# Patient Record
Sex: Male | Born: 1970 | Race: Black or African American | Hispanic: No | Marital: Single | Smoking: Never smoker
Health system: Southern US, Community
[De-identification: ages and names within clinical notes are randomized; demographics above are authoritative.]

## PROBLEM LIST (undated history)

## (undated) DIAGNOSIS — Z1211 Encounter for screening for malignant neoplasm of colon: Secondary | ICD-10-CM

## (undated) DIAGNOSIS — K921 Melena: Secondary | ICD-10-CM

---

## 1998-05-28 ENCOUNTER — Emergency Department (HOSPITAL_COMMUNITY): Admission: EM | Admit: 1998-05-28 | Discharge: 1998-05-28 | Payer: Self-pay | Admitting: Emergency Medicine

## 1999-07-28 ENCOUNTER — Emergency Department (HOSPITAL_COMMUNITY): Admission: EM | Admit: 1999-07-28 | Discharge: 1999-07-28 | Payer: Self-pay | Admitting: Emergency Medicine

## 2000-02-28 ENCOUNTER — Emergency Department (HOSPITAL_COMMUNITY): Admission: EM | Admit: 2000-02-28 | Discharge: 2000-02-28 | Payer: Self-pay | Admitting: Emergency Medicine

## 2000-09-12 ENCOUNTER — Emergency Department (HOSPITAL_COMMUNITY): Admission: EM | Admit: 2000-09-12 | Discharge: 2000-09-12 | Payer: Self-pay | Admitting: *Deleted

## 2001-03-14 ENCOUNTER — Emergency Department (HOSPITAL_COMMUNITY): Admission: EM | Admit: 2001-03-14 | Discharge: 2001-03-14 | Payer: Self-pay | Admitting: Emergency Medicine

## 2002-08-13 ENCOUNTER — Emergency Department (HOSPITAL_COMMUNITY): Admission: EM | Admit: 2002-08-13 | Discharge: 2002-08-13 | Payer: Self-pay | Admitting: Emergency Medicine

## 2003-02-02 ENCOUNTER — Encounter (INDEPENDENT_AMBULATORY_CARE_PROVIDER_SITE_OTHER): Payer: Self-pay | Admitting: *Deleted

## 2003-02-02 LAB — CONVERTED CEMR LAB
CD4 Count: 286 microliters
CD4 T Cell Abs: 286

## 2003-02-20 DIAGNOSIS — B20 Human immunodeficiency virus [HIV] disease: Secondary | ICD-10-CM

## 2003-03-03 ENCOUNTER — Encounter: Payer: Self-pay | Admitting: Infectious Diseases

## 2003-03-03 ENCOUNTER — Encounter: Admission: RE | Admit: 2003-03-03 | Discharge: 2003-03-03 | Payer: Self-pay | Admitting: Infectious Diseases

## 2003-03-03 ENCOUNTER — Ambulatory Visit (HOSPITAL_COMMUNITY): Admission: RE | Admit: 2003-03-03 | Discharge: 2003-03-03 | Payer: Self-pay | Admitting: Infectious Diseases

## 2003-06-03 ENCOUNTER — Encounter: Payer: Self-pay | Admitting: Infectious Diseases

## 2003-06-03 ENCOUNTER — Encounter: Admission: RE | Admit: 2003-06-03 | Discharge: 2003-06-03 | Payer: Self-pay | Admitting: Infectious Diseases

## 2003-06-03 ENCOUNTER — Ambulatory Visit (HOSPITAL_COMMUNITY): Admission: RE | Admit: 2003-06-03 | Discharge: 2003-06-03 | Payer: Self-pay | Admitting: Infectious Diseases

## 2005-01-10 ENCOUNTER — Ambulatory Visit: Payer: Self-pay | Admitting: Infectious Diseases

## 2005-01-10 ENCOUNTER — Ambulatory Visit (HOSPITAL_COMMUNITY): Admission: RE | Admit: 2005-01-10 | Discharge: 2005-01-10 | Payer: Self-pay | Admitting: Infectious Diseases

## 2005-01-25 ENCOUNTER — Ambulatory Visit: Payer: Self-pay | Admitting: Infectious Diseases

## 2005-02-08 ENCOUNTER — Emergency Department (HOSPITAL_COMMUNITY): Admission: EM | Admit: 2005-02-08 | Discharge: 2005-02-08 | Payer: Self-pay | Admitting: Emergency Medicine

## 2005-05-15 ENCOUNTER — Ambulatory Visit (HOSPITAL_COMMUNITY): Admission: RE | Admit: 2005-05-15 | Discharge: 2005-05-15 | Payer: Self-pay | Admitting: Infectious Diseases

## 2005-05-15 ENCOUNTER — Ambulatory Visit: Payer: Self-pay | Admitting: Infectious Diseases

## 2005-05-31 ENCOUNTER — Ambulatory Visit: Payer: Self-pay | Admitting: Infectious Diseases

## 2005-05-31 ENCOUNTER — Ambulatory Visit (HOSPITAL_COMMUNITY): Admission: RE | Admit: 2005-05-31 | Discharge: 2005-05-31 | Payer: Self-pay | Admitting: Infectious Diseases

## 2005-07-10 ENCOUNTER — Ambulatory Visit: Payer: Self-pay | Admitting: Infectious Diseases

## 2005-07-17 ENCOUNTER — Ambulatory Visit: Payer: Self-pay | Admitting: Infectious Diseases

## 2005-07-31 ENCOUNTER — Ambulatory Visit: Payer: Self-pay | Admitting: Infectious Diseases

## 2005-08-09 ENCOUNTER — Ambulatory Visit: Payer: Self-pay | Admitting: Infectious Diseases

## 2005-08-28 ENCOUNTER — Ambulatory Visit: Payer: Self-pay | Admitting: Infectious Diseases

## 2005-09-25 ENCOUNTER — Ambulatory Visit: Payer: Self-pay | Admitting: Infectious Diseases

## 2005-11-08 ENCOUNTER — Ambulatory Visit: Payer: Self-pay | Admitting: Infectious Diseases

## 2006-01-10 ENCOUNTER — Encounter (INDEPENDENT_AMBULATORY_CARE_PROVIDER_SITE_OTHER): Payer: Self-pay | Admitting: *Deleted

## 2006-01-10 ENCOUNTER — Ambulatory Visit: Payer: Self-pay | Admitting: Infectious Diseases

## 2006-01-10 LAB — CONVERTED CEMR LAB
CD4 Count: 506 microliters
HIV 1 RNA Quant: 49 copies/mL

## 2006-04-06 ENCOUNTER — Ambulatory Visit: Payer: Self-pay | Admitting: Infectious Diseases

## 2006-04-06 ENCOUNTER — Encounter (INDEPENDENT_AMBULATORY_CARE_PROVIDER_SITE_OTHER): Payer: Self-pay | Admitting: *Deleted

## 2006-04-12 DIAGNOSIS — J45909 Unspecified asthma, uncomplicated: Secondary | ICD-10-CM | POA: Insufficient documentation

## 2006-07-05 ENCOUNTER — Ambulatory Visit: Payer: Self-pay | Admitting: Infectious Diseases

## 2006-07-05 ENCOUNTER — Encounter (INDEPENDENT_AMBULATORY_CARE_PROVIDER_SITE_OTHER): Payer: Self-pay | Admitting: *Deleted

## 2006-07-05 LAB — CONVERTED CEMR LAB
ALT: 119 units/L — ABNORMAL HIGH (ref 0–53)
AST: 40 units/L — ABNORMAL HIGH (ref 0–37)
Alkaline Phosphatase: 109 units/L (ref 39–117)
BUN: 13 mg/dL (ref 6–23)
CD4 Count: 439 microliters
Calcium: 9.1 mg/dL (ref 8.4–10.5)
Cholesterol: 166 mg/dL (ref 0–200)
Creatinine, Ser: 0.91 mg/dL (ref 0.40–1.50)
Creatinine, Urine: 417.6 mg/dL
HIV 1 RNA Quant: 49 copies/mL
Hemoglobin, Urine: NEGATIVE
Indirect Bilirubin: 0.2 mg/dL (ref 0.0–0.9)
LDL Cholesterol: 89 mg/dL (ref 0–99)
Leukocytes, UA: NEGATIVE
Phosphorus: 4.3 mg/dL (ref 2.3–4.6)
Protein, ur: NEGATIVE mg/dL
Total CHOL/HDL Ratio: 4.7
Total Protein: 8.9 g/dL — ABNORMAL HIGH (ref 6.0–8.3)
VLDL: 42 mg/dL — ABNORMAL HIGH (ref 0–40)
pH: 5.5 (ref 5.0–8.0)

## 2006-08-27 ENCOUNTER — Encounter (INDEPENDENT_AMBULATORY_CARE_PROVIDER_SITE_OTHER): Payer: Self-pay | Admitting: *Deleted

## 2006-08-27 LAB — CONVERTED CEMR LAB

## 2006-09-09 ENCOUNTER — Encounter (INDEPENDENT_AMBULATORY_CARE_PROVIDER_SITE_OTHER): Payer: Self-pay | Admitting: *Deleted

## 2006-09-20 ENCOUNTER — Ambulatory Visit: Payer: Self-pay | Admitting: Infectious Diseases

## 2006-09-20 ENCOUNTER — Encounter: Payer: Self-pay | Admitting: Infectious Diseases

## 2006-09-20 LAB — CONVERTED CEMR LAB
ALT: 57 units/L — ABNORMAL HIGH (ref 0–53)
Alkaline Phosphatase: 133 units/L — ABNORMAL HIGH (ref 39–117)
CD4 Count: 310 microliters
Calcium: 9.5 mg/dL (ref 8.4–10.5)
Cholesterol: 146 mg/dL (ref 0–200)
Creatinine, Ser: 0.71 mg/dL (ref 0.40–1.50)
HDL: 36 mg/dL — ABNORMAL LOW (ref >39)
HIV 1 RNA Quant: 49 copies/mL
LDL Cholesterol: 75 mg/dL (ref 0–99)
Total Bilirubin: 0.3 mg/dL (ref 0.3–1.2)
Total CHOL/HDL Ratio: 4.1

## 2006-10-01 ENCOUNTER — Encounter: Payer: Self-pay | Admitting: Infectious Diseases

## 2006-12-19 ENCOUNTER — Ambulatory Visit: Payer: Self-pay | Admitting: Infectious Diseases

## 2007-02-01 ENCOUNTER — Emergency Department (HOSPITAL_COMMUNITY): Admission: EM | Admit: 2007-02-01 | Discharge: 2007-02-01 | Payer: Self-pay | Admitting: Emergency Medicine

## 2007-03-13 ENCOUNTER — Ambulatory Visit: Payer: Self-pay | Admitting: Infectious Diseases

## 2007-05-23 ENCOUNTER — Encounter (INDEPENDENT_AMBULATORY_CARE_PROVIDER_SITE_OTHER): Payer: Self-pay | Admitting: *Deleted

## 2007-05-27 ENCOUNTER — Ambulatory Visit: Payer: Self-pay | Admitting: Infectious Diseases

## 2007-05-27 ENCOUNTER — Encounter: Payer: Self-pay | Admitting: Infectious Diseases

## 2007-05-27 DIAGNOSIS — R21 Rash and other nonspecific skin eruption: Secondary | ICD-10-CM

## 2007-05-27 LAB — CONVERTED CEMR LAB
ALT: 73 units/L — ABNORMAL HIGH (ref 0–53)
Albumin: 4.2 g/dL (ref 3.5–5.2)
Bilirubin Urine: NEGATIVE
CD4 Count: 441 microliters
CO2: 26 meq/L (ref 19–32)
Calcium: 9.3 mg/dL (ref 8.4–10.5)
Chloride: 101 meq/L (ref 96–112)
Cholesterol: 150 mg/dL (ref 0–200)
Creatinine, Ser: 0.79 mg/dL (ref 0.40–1.50)
Glucose, Bld: 104 mg/dL — ABNORMAL HIGH (ref 70–99)
Hemoglobin, Urine: NEGATIVE
Hep A Total Ab: NEGATIVE
Hep B Core Total Ab: POSITIVE — AB
Hepatitis B Surface Ag: NEGATIVE
Ketones, ur: NEGATIVE mg/dL
LDL Cholesterol: 83 mg/dL (ref 0–99)
Leukocytes, UA: NEGATIVE
Nitrite: NEGATIVE
Phosphorus: 3.9 mg/dL (ref 2.3–4.6)
Potassium: 4.2 meq/L (ref 3.5–5.3)
Protein, ur: NEGATIVE mg/dL
Triglycerides: 160 mg/dL — ABNORMAL HIGH (ref <150)
Urine Glucose: NEGATIVE mg/dL
Urobilinogen, UA: 0.2 (ref 0.0–1.0)
VLDL: 32 mg/dL (ref 0–40)

## 2007-07-01 ENCOUNTER — Encounter (INDEPENDENT_AMBULATORY_CARE_PROVIDER_SITE_OTHER): Payer: Self-pay | Admitting: *Deleted

## 2007-07-02 ENCOUNTER — Encounter (INDEPENDENT_AMBULATORY_CARE_PROVIDER_SITE_OTHER): Payer: Self-pay | Admitting: *Deleted

## 2007-07-02 ENCOUNTER — Encounter: Payer: Self-pay | Admitting: Infectious Diseases

## 2007-08-22 ENCOUNTER — Ambulatory Visit: Payer: Self-pay | Admitting: Infectious Diseases

## 2007-10-07 ENCOUNTER — Emergency Department (HOSPITAL_COMMUNITY): Admission: EM | Admit: 2007-10-07 | Discharge: 2007-10-07 | Payer: Self-pay | Admitting: Family Medicine

## 2007-10-28 ENCOUNTER — Ambulatory Visit: Payer: Self-pay | Admitting: Infectious Diseases

## 2007-10-28 DIAGNOSIS — K625 Hemorrhage of anus and rectum: Secondary | ICD-10-CM

## 2007-11-18 ENCOUNTER — Ambulatory Visit: Payer: Self-pay | Admitting: Infectious Diseases

## 2007-12-04 ENCOUNTER — Encounter: Payer: Self-pay | Admitting: Infectious Diseases

## 2007-12-12 ENCOUNTER — Telehealth: Payer: Self-pay | Admitting: Infectious Diseases

## 2008-01-13 ENCOUNTER — Encounter: Payer: Self-pay | Admitting: Infectious Diseases

## 2008-02-11 ENCOUNTER — Emergency Department (HOSPITAL_COMMUNITY): Admission: EM | Admit: 2008-02-11 | Discharge: 2008-02-11 | Payer: Self-pay | Admitting: Emergency Medicine

## 2008-02-19 ENCOUNTER — Ambulatory Visit: Payer: Self-pay | Admitting: Infectious Diseases

## 2008-03-02 ENCOUNTER — Telehealth (INDEPENDENT_AMBULATORY_CARE_PROVIDER_SITE_OTHER): Payer: Self-pay | Admitting: *Deleted

## 2008-03-11 ENCOUNTER — Ambulatory Visit: Payer: Self-pay | Admitting: Infectious Diseases

## 2008-05-11 ENCOUNTER — Ambulatory Visit: Payer: Self-pay | Admitting: Infectious Diseases

## 2008-06-29 ENCOUNTER — Ambulatory Visit: Payer: Self-pay | Admitting: Infectious Diseases

## 2008-08-11 ENCOUNTER — Ambulatory Visit: Payer: Self-pay | Admitting: Infectious Diseases

## 2008-08-11 ENCOUNTER — Telehealth: Payer: Self-pay

## 2008-08-11 DIAGNOSIS — K029 Dental caries, unspecified: Secondary | ICD-10-CM | POA: Insufficient documentation

## 2008-08-11 DIAGNOSIS — L851 Acquired keratosis [keratoderma] palmaris et plantaris: Secondary | ICD-10-CM

## 2008-08-14 ENCOUNTER — Telehealth (INDEPENDENT_AMBULATORY_CARE_PROVIDER_SITE_OTHER): Payer: Self-pay | Admitting: *Deleted

## 2008-08-19 ENCOUNTER — Encounter: Payer: Self-pay | Admitting: Infectious Diseases

## 2008-11-20 ENCOUNTER — Ambulatory Visit: Payer: Self-pay | Admitting: Infectious Diseases

## 2008-11-20 LAB — CONVERTED CEMR LAB
Albumin: 4.2 g/dL (ref 3.5–5.2)
BUN: 13 mg/dL (ref 6–23)
Basophils Relative: 1 % (ref 0–1)
CO2: 24 meq/L (ref 19–32)
Chloride: 103 meq/L (ref 96–112)
Eosinophils Relative: 4 % (ref 0–5)
GFR calc non Af Amer: >60 mL/min (ref >60)
LDL Cholesterol: 103 mg/dL — ABNORMAL HIGH (ref 0–99)
Lymphocytes Relative: 32 % (ref 12–46)
MCHC: 34.1 g/dL (ref 30.0–36.0)
Monocytes Relative: 12 % (ref 3–12)
Neutro Abs: 2.6 10*3/uL (ref 1.7–7.7)
Neutrophils Relative %: 51 % (ref 43–77)
Platelets: 289 10*3/uL (ref 150–400)
RDW: 14.5 % (ref 11.5–15.5)
Total Protein: 7.6 g/dL (ref 6.0–8.3)
VLDL: 25 mg/dL (ref 0–40)

## 2009-01-28 ENCOUNTER — Telehealth (INDEPENDENT_AMBULATORY_CARE_PROVIDER_SITE_OTHER): Payer: Self-pay | Admitting: *Deleted

## 2009-02-12 ENCOUNTER — Ambulatory Visit: Payer: Self-pay | Admitting: Infectious Diseases

## 2009-04-29 ENCOUNTER — Encounter: Payer: Self-pay | Admitting: Infectious Disease

## 2009-05-05 ENCOUNTER — Encounter: Payer: Self-pay | Admitting: Infectious Diseases

## 2009-05-06 ENCOUNTER — Ambulatory Visit: Payer: Self-pay | Admitting: Infectious Diseases

## 2009-06-23 ENCOUNTER — Ambulatory Visit: Payer: Self-pay | Admitting: Infectious Diseases

## 2009-06-23 LAB — CONVERTED CEMR LAB
CD4 Count: 442 microliters
Calcium: 9.4 mg/dL (ref 8.4–10.5)
Creatinine, Ser: 0.79 mg/dL (ref 0.40–1.50)
Glucose, Bld: 118 mg/dL — ABNORMAL HIGH (ref 70–99)
HDL: 37 mg/dL — ABNORMAL LOW (ref >39)
HIV 1 RNA Quant: 39 copies/mL
Potassium: 4.2 meq/L (ref 3.5–5.3)
Sodium: 140 meq/L (ref 135–145)
Total CHOL/HDL Ratio: 4.6
VLDL: 25 mg/dL (ref 0–40)

## 2009-10-06 ENCOUNTER — Encounter: Payer: Self-pay | Admitting: Infectious Diseases

## 2009-10-21 ENCOUNTER — Ambulatory Visit: Payer: Self-pay | Admitting: Infectious Diseases

## 2009-10-21 LAB — CONVERTED CEMR LAB
Basophils Absolute: 0 10*3/uL (ref 0.0–0.1)
Basophils Relative: 1 % (ref 0–1)
CO2: 23 meq/L (ref 19–32)
Chloride: 104 meq/L (ref 96–112)
Cholesterol: 170 mg/dL (ref 0–200)
Creatinine, Ser: 0.85 mg/dL (ref 0.40–1.50)
Eosinophils Absolute: 0.2 10*3/uL (ref 0.0–0.7)
HCT: 43.3 % (ref 39.0–52.0)
HDL: 36 mg/dL — ABNORMAL LOW (ref >39)
Hemoglobin: 13.8 g/dL (ref 13.0–17.0)
Lymphs Abs: 1.3 10*3/uL (ref 0.7–4.0)
MCV: 82.5 fL (ref 78.0–100.0)
Neutro Abs: 2.5 10*3/uL (ref 1.7–7.7)
Neutrophils Relative %: 56 % (ref 43–77)
Sodium: 138 meq/L (ref 135–145)
Total Protein: 7.2 g/dL (ref 6.0–8.3)
WBC: 4.5 10*3/uL (ref 4.0–10.5)

## 2009-10-27 ENCOUNTER — Encounter: Payer: Self-pay | Admitting: Infectious Diseases

## 2009-11-10 ENCOUNTER — Encounter: Payer: Self-pay | Admitting: Infectious Diseases

## 2010-01-25 ENCOUNTER — Ambulatory Visit: Payer: Self-pay | Admitting: Infectious Diseases

## 2010-01-25 LAB — CONVERTED CEMR LAB
AST: 22 units/L (ref 0–37)
Albumin: 4.4 g/dL (ref 3.5–5.2)
Alkaline Phosphatase: 103 units/L (ref 39–117)
CO2: 25 meq/L (ref 19–32)
Cholesterol: 188 mg/dL (ref 0–200)
Creatinine, Ser: 0.83 mg/dL (ref 0.40–1.50)
Total Bilirubin: 0.4 mg/dL (ref 0.3–1.2)
Total CHOL/HDL Ratio: 5.2
Total Protein: 7.5 g/dL (ref 6.0–8.3)
Triglycerides: 170 mg/dL — ABNORMAL HIGH (ref <150)
VLDL: 34 mg/dL (ref 0–40)

## 2010-04-11 ENCOUNTER — Ambulatory Visit: Payer: Self-pay | Admitting: Internal Medicine

## 2010-04-11 DIAGNOSIS — J209 Acute bronchitis, unspecified: Secondary | ICD-10-CM | POA: Insufficient documentation

## 2010-05-20 ENCOUNTER — Ambulatory Visit: Payer: Self-pay | Admitting: Infectious Diseases

## 2010-06-02 ENCOUNTER — Ambulatory Visit: Payer: Self-pay | Admitting: Infectious Diseases

## 2010-06-02 ENCOUNTER — Encounter (INDEPENDENT_AMBULATORY_CARE_PROVIDER_SITE_OTHER): Payer: Self-pay | Admitting: *Deleted

## 2010-06-02 ENCOUNTER — Ambulatory Visit: Payer: Self-pay | Admitting: Internal Medicine

## 2010-06-02 DIAGNOSIS — R609 Edema, unspecified: Secondary | ICD-10-CM | POA: Insufficient documentation

## 2010-06-02 LAB — CONVERTED CEMR LAB
ALT: 66 units/L — ABNORMAL HIGH (ref 0–53)
AST: 24 units/L (ref 0–37)
BUN: 14 mg/dL (ref 6–23)
CD4 Count: 486 microliters
Chloride: 106 meq/L (ref 96–112)
Creatinine, Ser: 0.82 mg/dL (ref 0.40–1.50)
HDL: 37 mg/dL — ABNORMAL LOW (ref >39)
HIV 1 RNA Quant: 39 copies/mL
Sodium: 141 meq/L (ref 135–145)
Total Bilirubin: 0.3 mg/dL (ref 0.3–1.2)
Total Protein: 7.4 g/dL (ref 6.0–8.3)
Triglycerides: 147 mg/dL (ref <150)

## 2010-06-27 ENCOUNTER — Emergency Department (HOSPITAL_COMMUNITY)
Admission: EM | Admit: 2010-06-27 | Discharge: 2010-06-27 | Payer: Self-pay | Source: Home / Self Care | Admitting: Family Medicine

## 2010-07-04 ENCOUNTER — Emergency Department (HOSPITAL_COMMUNITY)
Admission: EM | Admit: 2010-07-04 | Discharge: 2010-07-04 | Payer: Self-pay | Source: Home / Self Care | Admitting: Family Medicine

## 2010-07-31 LAB — CONVERTED CEMR LAB
ALT: 56 units/L — ABNORMAL HIGH (ref 0–53)
ALT: 57 units/L — ABNORMAL HIGH (ref 0–53)
ALT: 74 units/L — ABNORMAL HIGH (ref 0–53)
AST: 29 units/L (ref 0–37)
Albumin: 4 g/dL (ref 3.5–5.2)
Albumin: 4.2 g/dL (ref 3.5–5.2)
Albumin: 4.6 g/dL (ref 3.5–5.2)
Alkaline Phosphatase: 103 units/L (ref 39–117)
Alkaline Phosphatase: 109 units/L (ref 39–117)
Alkaline Phosphatase: 113 units/L (ref 39–117)
Alkaline Phosphatase: 118 units/L — ABNORMAL HIGH (ref 39–117)
BUN: 10 mg/dL (ref 6–23)
BUN: 11 mg/dL (ref 6–23)
BUN: 12 mg/dL (ref 6–23)
BUN: 12 mg/dL (ref 6–23)
BUN: 13 mg/dL (ref 6–23)
BUN: 15 mg/dL (ref 6–23)
BUN: 9 mg/dL (ref 6–23)
Bilirubin Urine: NEGATIVE
Bilirubin, Direct: 0.1 mg/dL (ref 0.0–0.3)
Bilirubin, Direct: 0.1 mg/dL (ref 0.0–0.3)
Bilirubin, Direct: 0.1 mg/dL (ref 0.0–0.3)
Bilirubin, Direct: 0.1 mg/dL (ref 0.0–0.3)
Bilirubin, Direct: <0.1 mg/dL (ref 0.0–0.3)
CD4 Count: 319 microliters
CD4 Count: 372 microliters
CD4 Count: 383 microliters
CD4 Count: 413 microliters
CD4 Count: 688 microliters
CO2: 25 meq/L (ref 19–32)
CO2: 25 meq/L (ref 19–32)
CO2: 25 meq/L (ref 19–32)
CO2: 27 meq/L (ref 19–32)
CO2: 27 meq/L (ref 19–32)
CO2: 27 meq/L (ref 19–32)
CO2: 27 meq/L (ref 19–32)
Calcium: 8.7 mg/dL (ref 8.4–10.5)
Calcium: 8.9 mg/dL (ref 8.4–10.5)
Chloride: 102 meq/L (ref 96–112)
Chloride: 104 meq/L (ref 96–112)
Chloride: 104 meq/L (ref 96–112)
Chloride: 106 meq/L (ref 96–112)
Creatinine, Ser: 0.81 mg/dL (ref 0.40–1.50)
Creatinine, Ser: 0.82 mg/dL (ref 0.40–1.50)
Creatinine, Ser: 0.86 mg/dL (ref 0.40–1.50)
Creatinine, Ser: 0.88 mg/dL (ref 0.40–1.50)
Creatinine, Ser: 0.96 mg/dL (ref 0.40–1.50)
Creatinine, Urine: 228.1 mg/dL
Glucose, Bld: 110 mg/dL — ABNORMAL HIGH (ref 70–99)
Glucose, Bld: 112 mg/dL — ABNORMAL HIGH (ref 70–99)
Glucose, Bld: 93 mg/dL (ref 70–99)
HDL: 30 mg/dL — ABNORMAL LOW (ref >39)
HDL: 33 mg/dL — ABNORMAL LOW (ref >39)
HDL: 33 mg/dL — ABNORMAL LOW (ref >39)
HDL: 35 mg/dL — ABNORMAL LOW (ref >39)
HDL: 42 mg/dL (ref >39)
HIV 1 RNA Quant: 39 copies/mL
HIV 1 RNA Quant: 39 copies/mL
HIV 1 RNA Quant: 49 copies/mL
Hemoglobin, Urine: NEGATIVE
Hemoglobin, Urine: NEGATIVE
Hepatitis B Surface Ag: NEGATIVE
Indirect Bilirubin: 0.2 mg/dL (ref 0.0–0.9)
Ketones, ur: NEGATIVE mg/dL
Ketones, ur: NEGATIVE mg/dL
LDL Cholesterol: 136 mg/dL — ABNORMAL HIGH (ref 0–99)
LDL Cholesterol: 67 mg/dL (ref 0–99)
LDL Cholesterol: 86 mg/dL (ref 0–99)
Nitrite: NEGATIVE
Phosphorus: 4.1 mg/dL (ref 2.3–4.6)
Phosphorus: 5 mg/dL — ABNORMAL HIGH (ref 2.3–4.6)
Potassium: 4.3 meq/L (ref 3.5–5.3)
Protein, ur: NEGATIVE mg/dL
Sodium: 140 meq/L (ref 135–145)
Sodium: 141 meq/L (ref 135–145)
Sodium: 141 meq/L (ref 135–145)
Specific Gravity, Urine: 1.03 (ref 1.005–1.03)
Specific Gravity, Urine: 1.031 — ABNORMAL HIGH (ref 1.005–1.03)
Specific Gravity, Urine: 1.035 — ABNORMAL HIGH (ref 1.005–1.03)
Total Bilirubin: 0.3 mg/dL (ref 0.3–1.2)
Total Bilirubin: 0.3 mg/dL (ref 0.3–1.2)
Total Bilirubin: 0.5 mg/dL (ref 0.3–1.2)
Total CHOL/HDL Ratio: 4
Total CHOL/HDL Ratio: 4.2
Total CHOL/HDL Ratio: 4.8
Total CHOL/HDL Ratio: 5.6
Total CHOL/HDL Ratio: 5.7
Total Protein: 7.7 g/dL (ref 6.0–8.3)
Total Protein: 7.7 g/dL (ref 6.0–8.3)
Total Protein: 8 g/dL (ref 6.0–8.3)
Total Protein: 8.1 g/dL (ref 6.0–8.3)
Total Protein: 8.9 g/dL — ABNORMAL HIGH (ref 6.0–8.3)
Triglycerides: 124 mg/dL (ref <150)
Triglycerides: 149 mg/dL (ref <150)
Triglycerides: 201 mg/dL — ABNORMAL HIGH (ref <150)
Triglycerides: 70 mg/dL (ref <150)
Triglycerides: 97 mg/dL (ref <150)
Urine Glucose: NEGATIVE mg/dL
Urine Glucose: NEGATIVE mg/dL
Urine Glucose: NEGATIVE mg/dL
Urobilinogen, UA: 1 (ref 0.0–1.0)
VLDL: 14 mg/dL (ref 0–40)
VLDL: 25 mg/dL (ref 0–40)
VLDL: 40 mg/dL (ref 0–40)
pH: 5.5 (ref 5.0–8.0)
pH: 6 (ref 5.0–8.0)

## 2010-08-02 NOTE — Miscellaneous (Signed)
Summary: Appointment No Show  Appointment status changed to no show by [employer] on 10/06/2009 4:36 PM.  No Show Comments ---------------- 5 MONTH F/U/CFB  Appointment Information ----------------------- Appt Type:  ID OFFICE VISIT      Date:  Wednesday, October 06, 2009      Time:  10:00 AM for 15 min   Urgency:  Routine   Made By:  Hoy Finlay Scheduler  To Visit:  [id number]    Reason:  5 MONTH F/U/CFB  Appt Comments ------------- -- 10/06/09 16:36: ([facility]) NO SHOW -- 5 MONTH F/U/CFB -- 09/24/09 10:01: ([facility]) BOOKED -- Routine ID OFFICE VISIT at 10/06/2009 10:00 AM for 15 min 5 MONTH F/U/CFB

## 2010-08-02 NOTE — Miscellaneous (Signed)
Summary: HIV-1 RNA, CD4 (RESEARCH)  Clinical Lists Changes  Observations: Added new observation of CD4 COUNT: 442 microliters (06/23/2009 15:50) Added new observation of HIV1RNA QA: 39 copies/mL (06/23/2009 15:50)

## 2010-08-02 NOTE — Assessment & Plan Note (Signed)
Summary: F/U OV/VS   CC:  f/u and wants cheaper asthma medicine.  History of Present Illness: patient is here for follow-up.  He complains of some swelling in his feet.  No shortness of breath or chest pain.  No PND.  He would also like to keep her albuterol inhaler because he's having problems affording his current one.   Updated Prior Medication List: SUSTIVA 600 MG TABS (EFAVIRENZ) Take 1 tablet by mouth at bedtime EPZICOM 600-300 MG TABS (ABACAVIR SULFATE-LAMIVUDINE) Take 1 tablet by mouth once a day FLOVENT DISKUS 100 MCG/BLIST AEPB (FLUTICASONE PROPIONATE (INHAL)) one inhalation once daily SINGULAIR 10 MG TABS (MONTELUKAST SODIUM) Take 1 tablet by mouth once a day ALBUTEROL SULFATE 0.083 % NEB SOL (ALBUTEROL SULFATE) every 4 hours as needed for wheezing. LASIX 40 MG TABS (FUROSEMIDE) Take 1 tablet by mouth once a day VENTOLIN HFA 108 (90 BASE) MCG/ACT AERS (ALBUTEROL SULFATE) one puff every 6hours prn  Current Allergies (reviewed today): No known allergies  Past History:  Past Medical History: Last updated: 05/06/2009 HIV disease Asthma- no home O2, never intubated.   Review of Systems  The patient denies anorexia, fever, weight loss, chest pain, and dyspnea on exertion.    Vital Signs:  Patient profile:   40 year old male Height:      67 inches (170.18 cm) Weight:      245 pounds (111.36 kg) BMI:     38.51 Temp:     98.2 degrees F (36.78 degrees C) oral Pulse rate:   89 / minute BP sitting:   137 / 79  (left arm)  Vitals Entered By: Starleen Arms CMA (June 02, 2010 3:02 PM) CC: f/u, wants cheaper asthma medicine  Does patient need assistance? Functional Status Self care Ambulation Normal   Physical Exam  General:  alert, well-developed, well-nourished, and well-hydrated.   Head:  normocephalic and atraumatic.   Mouth:  pharynx pink and moist.  no thrush  Lungs:  normal breath sounds and no wheezes.     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 F/u with Dr. Ninetta Lights. Orders: Est. Patient Level IV (82956)  Diagnostics Reviewed:  CD4: 500 (01/25/2010)   WBC: 4.5 (10/21/2009)   Hgb: 13.8 (10/21/2009)   HCT: 43.3 (10/21/2009)   Platelets: 314 (10/21/2009) HIV-1 RNA: 39 (01/25/2010)   HBSAg: NEG (02/12/2009)  Problem # 2:  ASTHMA (ICD-493.90) will switch to ventolin which according tothe computer list is the cheapest. The following medications were removed from the medication list:    Proair Hfa 108 (90 Base) Mcg/act Aers (Albuterol sulfate) .Marland Kitchen... 1-2 puffs q4 hours as needed shortness of breath His updated medication list for this problem includes:    Flovent Diskus 100 Mcg/blist Aepb (Fluticasone propionate (inhal)) ..... One inhalation once daily    Singulair 10 Mg Tabs (Montelukast sodium) .Marland Kitchen... Take 1 tablet by mouth once a day    Albuterol Sulfate 0.083 % Neb Sol (Albuterol sulfate) ..... Every 4 hours as needed for wheezing.    Ventolin Hfa 108 (90 Base) Mcg/act Aers (Albuterol sulfate) ..... One puff every 6hours prn  Problem # 3:  PERIPHERAL EDEMA (ICD-782.3) shor course of lasix His updated medication list for this problem includes:    Lasix 40 Mg Tabs (Furosemide) .Marland Kitchen... Take 1 tablet by mouth once a day  Medications Added to Medication List This Visit: 1)  Lasix 40 Mg Tabs (Furosemide) .... Take 1 tablet by  mouth once a day 2)  Ventolin Hfa 108 (90 Base) Mcg/act Aers (Albuterol sulfate) .... One puff every 6hours prn  Patient Instructions: 1)  follow-up per study Prescriptions: LASIX 40 MG TABS (FUROSEMIDE) Take 1 tablet by mouth once a day  #7 x 0   Entered and Authorized by:   Yisroel Ramming MD   Signed by:   Yisroel Ramming MD on 06/02/2010   Method used:   Print then Give to Patient   RxID:   [id number] VENTOLIN HFA 108 (90 BASE) MCG/ACT AERS (ALBUTEROL SULFATE) one puff every 6hours prn  #1 x 5   Entered and Authorized by:   Yisroel Ramming MD   Signed by:   Yisroel Ramming MD on 06/02/2010   Method used:   Print then Give to Patient   RxID:   [id number] EPZICOM 600-300 MG TABS (ABACAVIR SULFATE-LAMIVUDINE) Take 1 tablet by mouth once a day  #90.0 Tablet x 3   Entered and Authorized by:   Yisroel Ramming MD   Signed by:   Yisroel Ramming MD on 06/02/2010   Method used:   Print then Give to Patient   RxID:   [id number] SUSTIVA 600 MG TABS (EFAVIRENZ) Take 1 tablet by mouth at bedtime  #90 x 3   Entered and Authorized by:   Yisroel Ramming MD   Signed by:   Yisroel Ramming MD on 06/02/2010   Method used:   Print then Give to Patient   RxID:   [id number]

## 2010-08-02 NOTE — Miscellaneous (Signed)
Summary: HIV-1 RNA, CD4 (RESEARCH)  Clinical Lists Changes  Observations: Added new observation of CD4 COUNT: 500 microliters (01/25/2010 14:42) Added new observation of HIV1RNA QA: 39 copies/mL (01/25/2010 14:42)

## 2010-08-02 NOTE — Miscellaneous (Signed)
Summary: Orders Update  Clinical Lists Changes  Medications: Removed medication of PROVENTIL HFA 108 (90 BASE) MCG/ACT AERS (ALBUTEROL SULFATE) 1-2 inhalations every 4 hours as needed Added new medication of PROAIR HFA 108 (90 BASE) MCG/ACT AERS (ALBUTEROL SULFATE) 1-2 puffs q4 hours as needed shortness of breath - Signed Rx of PROAIR HFA 108 (90 BASE) MCG/ACT AERS (ALBUTEROL SULFATE) 1-2 puffs q4 hours as needed shortness of breath;  #30 days x 3;  Signed;  Entered by: Johny Sax MD;  Authorized by: Johny Sax MD;  Method used: Electronically to [facility]. [phone]*, [address], [place], [place]  [address], Ph: [phone] or [phone], Fax: [phone]    Prescriptions: PROAIR HFA 108 (90 BASE) MCG/ACT AERS (ALBUTEROL SULFATE) 1-2 puffs q4 hours as needed shortness of breath  #30 days x 3   Entered and Authorized by:   Johny Sax MD   Signed by:   Johny Sax MD on 10/27/2009   Method used:   Electronically to        [facility]. [phone]* (retail)       [address]       [place], [place]  [address]       Ph: [phone] or [phone]       Fax: [phone]   RxID:   [id number]

## 2010-08-02 NOTE — Assessment & Plan Note (Signed)
Summary: cold sxs, chest congestion   CC:  pt. c/o chest and nasal congestion and chills x3 days.  History of Present Illness: Pt c/o 3 days of sinus and chest congestion.  Cough productive of mostly clear sputum.  No fever.  OTC meds not helping.  Pt has a history of asthma.  Preventive Screening-Counseling & Management  Alcohol-Tobacco     Alcohol drinks/day: 0     Smoking Status: never     Passive Smoke Exposure: yes  Caffeine-Diet-Exercise     Caffeine use/day: 1     Does Patient Exercise: yes     Type of exercise: walking to and from job     Times/week: 7  Hep-HIV-STD-Contraception     HIV Risk: risk noted  Safety-Violence-Falls     Seat Belt Use: 100      Sexual History:  currently monogamous.        Drug Use:  no.    Comments: pt. declined condoms   Updated Prior Medication List: SUSTIVA 600 MG TABS (EFAVIRENZ) Take 1 tablet by mouth at bedtime EPZICOM 600-300 MG TABS (ABACAVIR SULFATE-LAMIVUDINE) Take 1 tablet by mouth once a day FLOVENT DISKUS 100 MCG/BLIST AEPB (FLUTICASONE PROPIONATE (INHAL)) one inhalation once daily SINGULAIR 10 MG TABS (MONTELUKAST SODIUM) Take 1 tablet by mouth once a day ALBUTEROL SULFATE 0.083 % NEB SOL (ALBUTEROL SULFATE) every 4 hours as needed for wheezing. PROAIR HFA 108 (90 BASE) MCG/ACT AERS (ALBUTEROL SULFATE) 1-2 puffs q4 hours as needed shortness of breath TUSSIONEX PENNKINETIC ER 10-8 MG/5ML LQCR (HYDROCOD POLST-CHLORPHEN POLST) 5ml two times a day AUGMENTIN 875-125 MG TABS (AMOXICILLIN-POT CLAVULANATE) Take 1 tablet by mouth two times a day  Current Allergies (reviewed today): No known allergies  Past History:  Past Medical History: Last updated: 05/06/2009 HIV disease Asthma- no home O2, never intubated.   Social History: Sexual History:  currently monogamous  Review of Systems  The patient denies anorexia, fever, weight loss, dyspnea on exertion, and hemoptysis.    Vital Signs:  Patient profile:   40 year  old male Height:      67 inches (170.18 cm) Weight:      239.8 pounds (109 kg) BMI:     37.69 Temp:     98.1 degrees F (36.72 degrees C) oral Pulse rate:   92 / minute BP sitting:   134 / 88  (right arm)  Vitals Entered By: Wendall Mola CMA Duncan Dull) (April 11, 2010 3:11 PM) CC: pt. c/o chest and nasal congestion, chills x3 days Is Patient Diabetic? No Pain Assessment Patient in pain? no      Nutritional Status BMI of > 30 = obese Nutritional Status Detail appetite "lower"  Does patient need assistance? Functional Status Self care Ambulation Normal Comments no missed doses of HAART meds per pt.   Physical Exam  General:  alert, well-developed, well-nourished, and well-hydrated.   Head:  normocephalic and atraumatic.   Mouth:  no erythema and no exudates.   Lungs:  normal breath sounds and no wheezes.     Impression & Recommendations:  Problem # 1:  ACUTE BRONCHITIS (ICD-466.0) treat with tussionex and augmentin His updated medication list for this problem includes:    Flovent Diskus 100 Mcg/blist Aepb (Fluticasone propionate (inhal)) ..... One inhalation once daily    Singulair 10 Mg Tabs (Montelukast sodium) .Marland Kitchen... Take 1 tablet by mouth once a day    Albuterol Sulfate 0.083 % Neb Sol (Albuterol sulfate) ..... Every 4 hours as needed for wheezing.  Proair Hfa 108 (90 Base) Mcg/act Aers (Albuterol sulfate) .Marland Kitchen... 1-2 puffs q4 hours as needed shortness of breath    Tussionex Pennkinetic Er 10-8 Mg/39ml Lqcr (Hydrocod polst-chlorphen polst) .Marland KitchenMarland KitchenMarland KitchenMarland Kitchen 5ml two times a day    Augmentin 875-125 Mg Tabs (Amoxicillin-pot clavulanate) .Marland Kitchen... Take 1 tablet by mouth two times a day  Medications Added to Medication List This Visit: 1)  Tussionex Pennkinetic Er 10-8 Mg/71ml Lqcr (Hydrocod polst-chlorphen polst) .... 5ml two times a day 2)  Augmentin 875-125 Mg Tabs (Amoxicillin-pot clavulanate) .... Take 1 tablet by mouth two times a day  Other Orders: Est. Patient Level III  ([address]) Influenza Vaccine NON MCR (60454) Prescriptions: AUGMENTIN 875-125 MG TABS (AMOXICILLIN-POT CLAVULANATE) Take 1 tablet by mouth two times a day  #20 x 0   Entered and Authorized by:   Yisroel Ramming MD   Signed by:   Yisroel Ramming MD on 04/11/2010   Method used:   Print then Give to Patient   RxID:   [id number] TUSSIONEX PENNKINETIC ER 10-8 MG/5ML LQCR (HYDROCOD POLST-CHLORPHEN POLST) 5ml two times a day  #4 oz x 0   Entered and Authorized by:   Yisroel Ramming MD   Signed by:   Yisroel Ramming MD on 04/11/2010   Method used:   Print then Give to Patient   RxID:   [id number] PROAIR HFA 108 (90 BASE) MCG/ACT AERS (ALBUTEROL SULFATE) 1-2 puffs q4 hours as needed shortness of breath  #30 days x 3   Entered and Authorized by:   Yisroel Ramming MD   Signed by:   Yisroel Ramming MD on 04/11/2010   Method used:   Print then Give to Patient   RxID:   [id number] EPZICOM 600-300 MG TABS (ABACAVIR SULFATE-LAMIVUDINE) Take 1 tablet by mouth once a day  #90 x 3   Entered and Authorized by:   Yisroel Ramming MD   Signed by:   Yisroel Ramming MD on 04/11/2010   Method used:   Print then Give to Patient   RxID:   [id number] SUSTIVA 600 MG TABS (EFAVIRENZ) Take 1 tablet by mouth at bedtime  #90 x 3   Entered and Authorized by:   Yisroel Ramming MD   Signed by:   Yisroel Ramming MD on 04/11/2010   Method used:   Print then Give to Patient   RxID:   [id number]      Immunizations Administered:  Influenza Vaccine # 1:    Vaccine Type: Fluvax Non-MCR    Site: right deltoid    Mfr: Novartis    Dose: 0.5 ml    Route: IM    Given by: Wendall Mola CMA ( AAMA)    Exp. Date: 10/02/2010    Lot #: [id number]    VIS given: 01/25/10 version given April 11, 2010.  Flu Vaccine Consent Questions:    Do you have a history of severe allergic reactions to this vaccine? no    Any prior history of allergic reactions to egg and/or gelatin? no    Do you have a sensitivity to the  preservative Thimersol? no    Do you have a past history of Guillan-Barre Syndrome? no    Do you currently have an acute febrile illness? no    Have you ever had a severe reaction to latex? no    Vaccine information given and explained to patient? yes

## 2010-08-02 NOTE — Assessment & Plan Note (Signed)
Summary: STUDY APPT/ LH    Current Allergies: No known allergies  Vital Signs:  Patient profile:   40 year old male Weight:      233.25 pounds (106.02 kg) BMI:     36.66 Temp:     97.9 degrees F oral Pulse rate:   84 / minute BP sitting:   113 / 77  (left arm) Is Patient Diabetic? No Pain Assessment Patient in pain? no      Nutritional Status BMI of > 30 = obese  Does patient need assistance? Functional Status Self care Ambulation Normal   Patient here for week 240 ALLRT study visit. C/O dry cough past week, no fever or sore throat. Needs to schedule an appt with Dr. Ninetta Lights soon.Deirdre Evener RN  January 25, 2010 10:33 AM    Other Orders: Est. Patient Research Study [id number]) T-Comprehensive Metabolic Panel [id number]) T-Lipid Profile [id number]) T-Urine Protein [id number]) T-Urine Creatinine [id number])  Process Orders Check Orders Results:     [facility]: ABN not required for this insurance Order queued for requisitioning for [facility]: January 25, 2010 9:37 AM  Tests Sent for requisitioning (January 25, 2010 9:37 AM):     01/25/2010: [facility] -- T-Comprehensive Metabolic Panel [[id number]] (signed)     01/25/2010: [facility] -- T-Lipid Profile [id number] (signed)     01/25/2010: [facility] -- T-Urine Protein [phone] (signed)     01/25/2010: [facility] -- T-Urine Creatinine [[id number]] (signed)

## 2010-08-02 NOTE — Letter (Signed)
Summary: [facility]   [facility]   Imported By: Florinda Marker 11/23/2009 15:31:42  _____________________________________________________________________  External Attachment:    Type:   Image     Comment:   External Document

## 2010-08-02 NOTE — Assessment & Plan Note (Signed)
Summary: STUDY APPT/ LH    Current Allergies: No known allergies  Vital Signs:  Patient profile:   40 year old male Weight:      242 pounds (110.00 kg) BMI:     38.04 Temp:     97.6 degrees F oral Pulse rate:   79 / minute BP sitting:   123 / 79  (left arm) Is Patient Diabetic? No Pain Assessment Patient in pain? no      Nutritional Status BMI of > 30 = obese  Does patient need assistance? Functional Status Self care Ambulation Normal   Patient here for week 224 ALLRT  study visit. He denies any new problems. He needs his Proventil inhaler switched to either ventolin or proair for insurance reasons.Deirdre Evener RN  October 21, 2009 11:56 AM    Other Orders: Est. Patient Research Study [phone]) T-CBC w/Diff [phone]) T-Comprehensive Metabolic Panel [id number]) T-Lipid Profile [id number])  Process Orders Check Orders Results:     [facility]: ABN not required for this insurance Order queued for requisitioning for [facility]: October 21, 2009 11:45 AM  Tests Sent for requisitioning (October 21, 2009 11:45 AM):     10/21/2009: [facility] -- T-CBC w/Diff [[id number]] (signed)     10/21/2009: [facility] -- T-Comprehensive Metabolic Panel [[id number]] (signed)     10/21/2009: [facility] -- T-Lipid Profile [phone] (signed)

## 2010-08-02 NOTE — Miscellaneous (Signed)
Summary: RW Financial Update   Clinical Lists Changes  Observations: Added new observation of PCTFPL: 365.75  (06/02/2010 10:51) Added new observation of HOUSEINCOME: [address]  (06/02/2010 10:51) Added new observation of FINASSESSDT: 06/02/2010  (06/02/2010 10:51)

## 2010-08-04 NOTE — Miscellaneous (Signed)
Summary: HIV-1 RNA, CD4 (RESEARCH)  Clinical Lists Changes  Observations: Added new observation of CD4 COUNT: 486 microliters (06/02/2010 14:31) Added new observation of HIV1RNA QA: 39 copies/mL (06/02/2010 14:31)

## 2010-10-18 ENCOUNTER — Encounter: Payer: Self-pay | Admitting: *Deleted

## 2010-10-18 ENCOUNTER — Ambulatory Visit (INDEPENDENT_AMBULATORY_CARE_PROVIDER_SITE_OTHER): Payer: Self-pay | Admitting: *Deleted

## 2010-10-18 VITALS — BP 129/78 | HR 88 | Temp 98.5°F | Wt 239.5 lb

## 2010-10-18 DIAGNOSIS — B20 Human immunodeficiency virus [HIV] disease: Secondary | ICD-10-CM

## 2010-10-18 LAB — COMPREHENSIVE METABOLIC PANEL
AST: 28 U/L (ref 0–37)
Albumin: 4.3 g/dL (ref 3.5–5.2)
Alkaline Phosphatase: 93 U/L (ref 39–117)
BUN: 12 mg/dL (ref 6–23)
Calcium: 8.9 mg/dL (ref 8.4–10.5)
Creat: 0.82 mg/dL (ref 0.40–1.50)
Glucose, Bld: 123 mg/dL — ABNORMAL HIGH (ref 70–99)
Potassium: 4 mEq/L (ref 3.5–5.3)

## 2010-10-18 LAB — CBC WITH DIFFERENTIAL/PLATELET
Hemoglobin: 14.2 g/dL (ref 13.0–17.0)
Lymphocytes Relative: 25 % (ref 12–46)
Lymphs Abs: 1.1 10*3/uL (ref 0.7–4.0)
Monocytes Relative: 10 % (ref 3–12)
Neutro Abs: 2.8 10*3/uL (ref 1.7–7.7)
Neutrophils Relative %: 61 % (ref 43–77)
Platelets: 248 10*3/uL (ref 150–400)
RBC: 5.38 MIL/uL (ref 4.22–5.81)
WBC: 4.5 10*3/uL (ref 4.0–10.5)

## 2010-10-18 LAB — LIPID PANEL
Cholesterol: 168 mg/dL (ref 0–200)
Total CHOL/HDL Ratio: 4.8 Ratio
Triglycerides: 253 mg/dL — ABNORMAL HIGH (ref <150)
VLDL: 51 mg/dL — ABNORMAL HIGH (ref 0–40)

## 2010-10-18 NOTE — Progress Notes (Signed)
Patient here for week 272 ALLRT study visit. He denies any current problems. He has lost a few pounds, which has been an intentional weight loss. He plans on seeing Dr. Ninetta Lights in July after his June research visit.

## 2010-10-25 ENCOUNTER — Encounter: Payer: Self-pay | Admitting: *Deleted

## 2010-12-22 ENCOUNTER — Ambulatory Visit (INDEPENDENT_AMBULATORY_CARE_PROVIDER_SITE_OTHER): Payer: Self-pay | Admitting: *Deleted

## 2010-12-22 VITALS — BP 118/79 | HR 89 | Temp 97.8°F | Wt 240.8 lb

## 2010-12-22 DIAGNOSIS — B20 Human immunodeficiency virus [HIV] disease: Secondary | ICD-10-CM

## 2010-12-22 LAB — LIPID PANEL
Cholesterol: 179 mg/dL (ref 0–200)
HDL: 42 mg/dL (ref >39)
Total CHOL/HDL Ratio: 4.3 Ratio

## 2010-12-22 LAB — COMPREHENSIVE METABOLIC PANEL
AST: 27 U/L (ref 0–37)
Albumin: 4.2 g/dL (ref 3.5–5.2)
BUN: 10 mg/dL (ref 6–23)
CO2: 25 mEq/L (ref 19–32)
Calcium: 9.1 mg/dL (ref 8.4–10.5)
Chloride: 105 mEq/L (ref 96–112)
Creat: 0.89 mg/dL (ref 0.50–1.35)
Potassium: 4.1 mEq/L (ref 3.5–5.3)

## 2010-12-23 LAB — PROTEIN, URINE, RANDOM: Total Protein, Urine: 12 mg/dL

## 2010-12-23 LAB — CREATININE, URINE, RANDOM: Creatinine, Urine: 191.7 mg/dL

## 2010-12-23 LAB — HEPATITIS B SURFACE ANTIGEN: Hepatitis B Surface Ag: NEGATIVE

## 2010-12-23 LAB — HEPATITIS C ANTIBODY: HCV Ab: NEGATIVE

## 2010-12-23 LAB — HEPATITIS A ANTIBODY, TOTAL: Hep A Total Ab: POSITIVE — AB

## 2010-12-23 NOTE — Progress Notes (Signed)
Patient was here for week 288 ALLRT study visit. He denies any problems or concerns. He says he has been very adherent with his meds. He is to schedule an appt to see Dr. Ninetta Lights soon.

## 2011-01-12 ENCOUNTER — Encounter: Payer: Self-pay | Admitting: Infectious Diseases

## 2011-01-12 LAB — CD4/CD8 (T-HELPER/T-SUPPRESSOR CELL)
CD4%: 39.7
CD4: 556
CD8 % Suppressor T Cell: 25.9
CD8: 363

## 2011-03-10 IMAGING — CR DG CHEST 2V
3 series · 3 of 3 positions shown · non-contrast
Comparison: 02/11/2008

CLINICAL DATA: Cough, shortness of breath

CHEST - 2 VIEW

[view not recorded (1 of 3)]
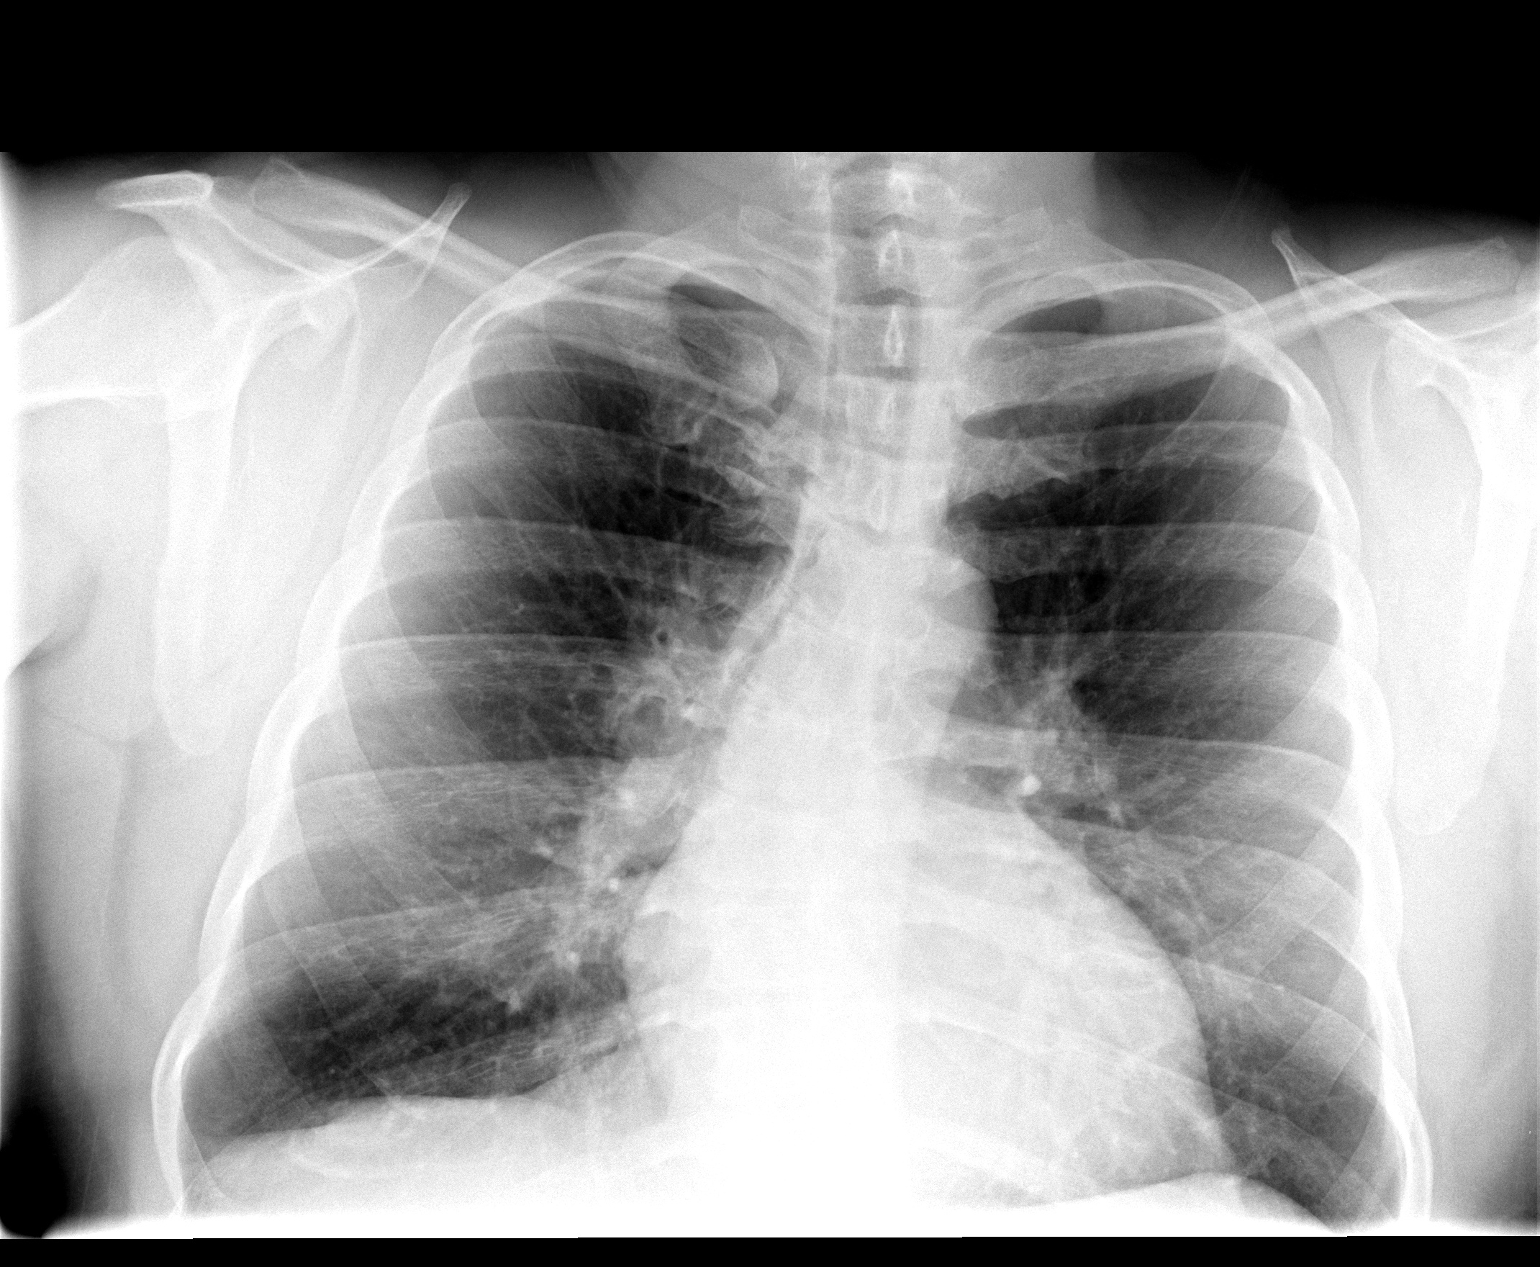

[view not recorded (2 of 3)]
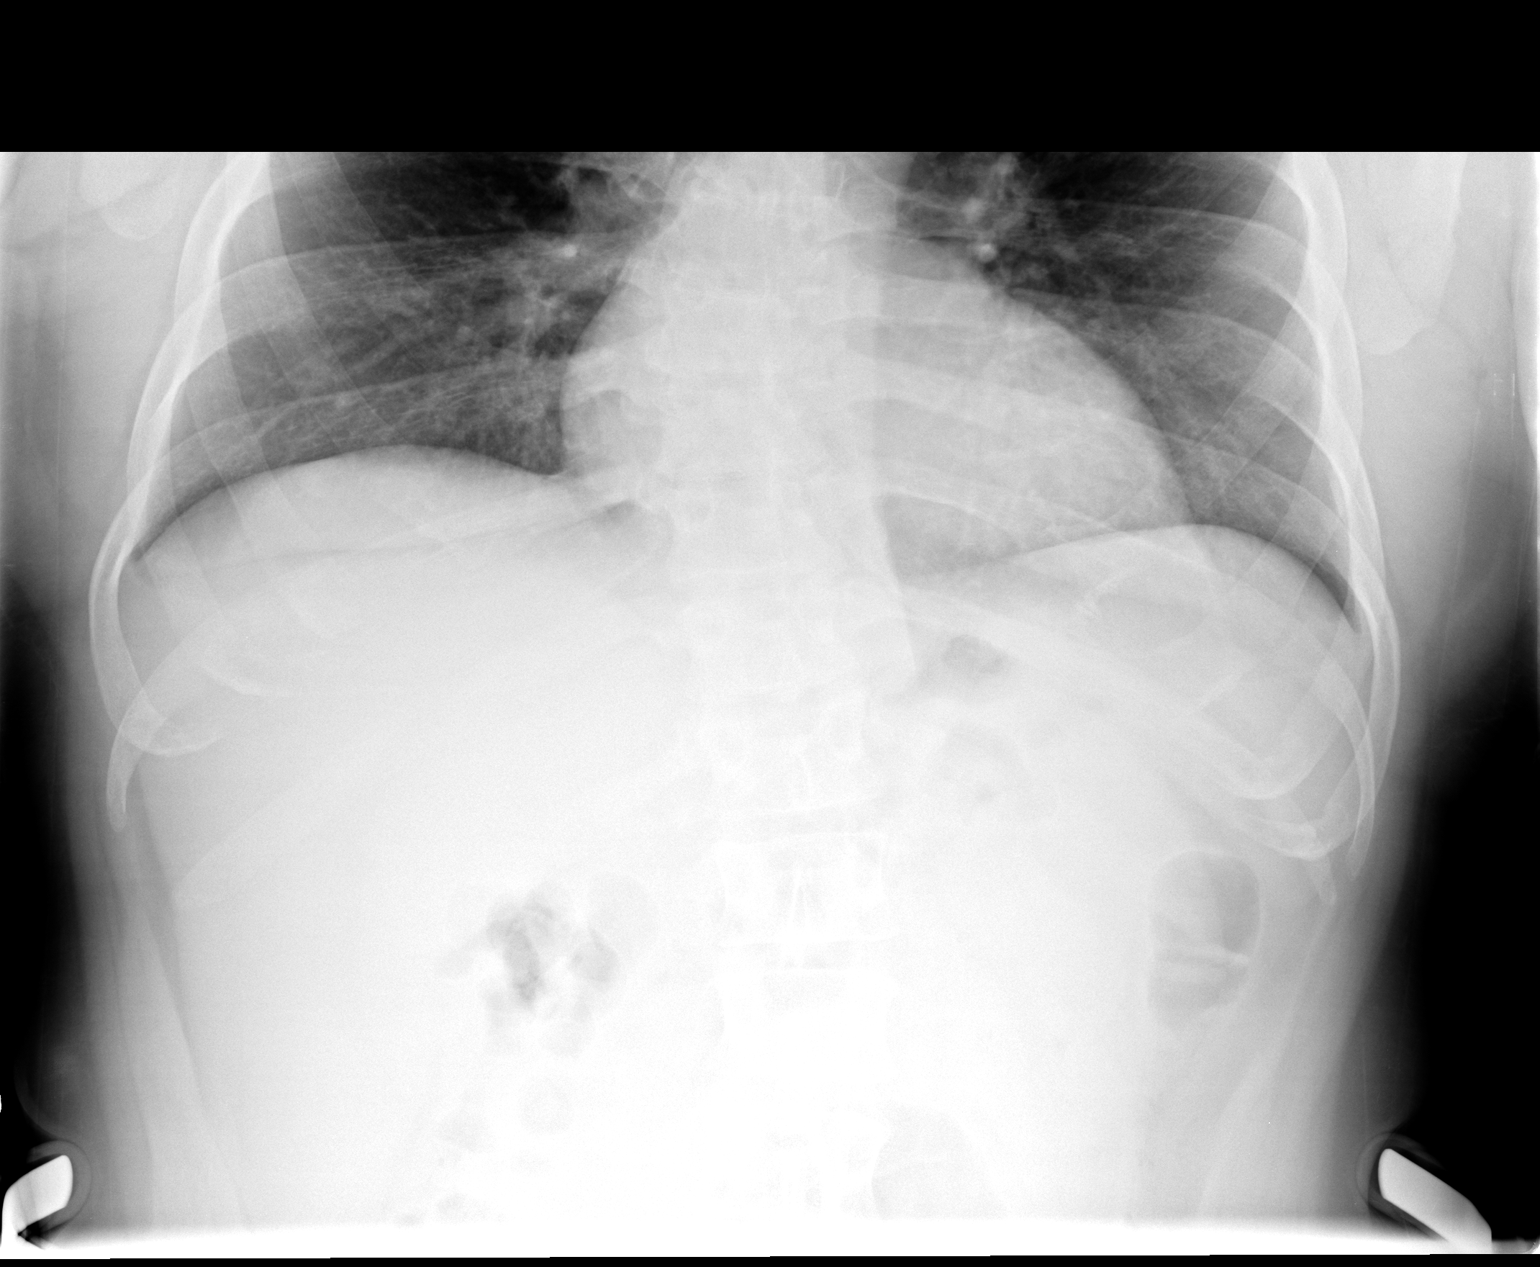

[view not recorded (3 of 3)]
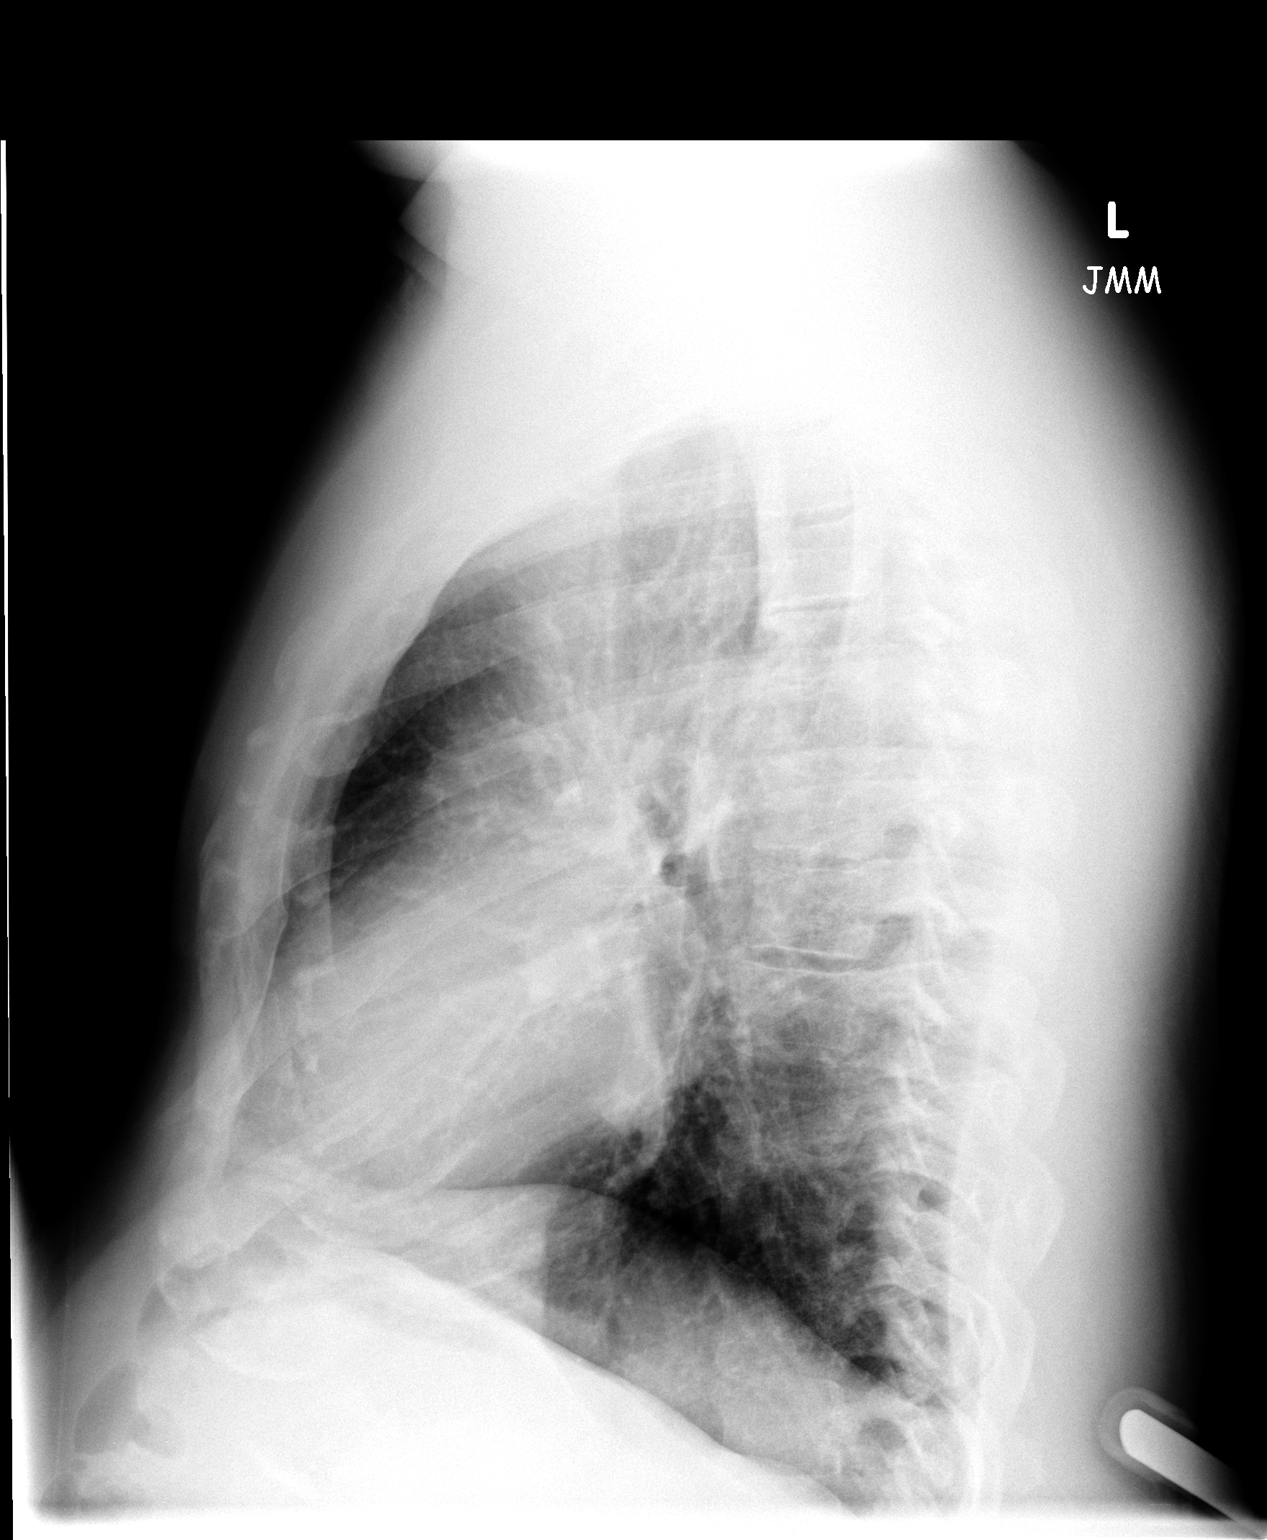

[3 of 3 positions shown; findings below may reference images not displayed]

FINDINGS: Biconvex thoracolumbar scoliosis.
Upper normal heart size.
Normal mediastinal contours and pulmonary vascularity.
Bronchitic changes without infiltrate or effusion.
No pneumothorax.
IMPRESSION: Scoliosis.
Mild chronic bronchitic changes.

## 2011-03-28 LAB — POCT URINALYSIS DIP (DEVICE)
Hgb urine dipstick: NEGATIVE
Nitrite: NEGATIVE
Specific Gravity, Urine: 1.02
Urobilinogen, UA: 0.2
pH: 7

## 2011-04-13 ENCOUNTER — Ambulatory Visit (INDEPENDENT_AMBULATORY_CARE_PROVIDER_SITE_OTHER): Payer: Self-pay | Admitting: *Deleted

## 2011-04-13 ENCOUNTER — Encounter: Payer: Self-pay | Admitting: *Deleted

## 2011-04-13 VITALS — BP 123/80 | HR 88 | Temp 98.1°F | Wt 239.5 lb

## 2011-04-13 DIAGNOSIS — B20 Human immunodeficiency virus [HIV] disease: Secondary | ICD-10-CM

## 2011-04-13 LAB — LIPID PANEL
HDL: 35 mg/dL — ABNORMAL LOW (ref >39)
LDL Cholesterol: 98 mg/dL (ref 0–99)
VLDL: 41 mg/dL — ABNORMAL HIGH (ref 0–40)

## 2011-04-13 LAB — COMPREHENSIVE METABOLIC PANEL
ALT: 60 U/L — ABNORMAL HIGH (ref 0–53)
AST: 27 U/L (ref 0–37)
CO2: 28 mEq/L (ref 19–32)
Chloride: 103 mEq/L (ref 96–112)
Sodium: 140 mEq/L (ref 135–145)
Total Bilirubin: 0.3 mg/dL (ref 0.3–1.2)
Total Protein: 7.4 g/dL (ref 6.0–8.3)

## 2011-04-13 NOTE — Progress Notes (Signed)
Patient here for week 304 ALLRT study visit. He denies any current problems. He has already had a flushot at his primary care MD's office a few weeks ago. He will return in February for the next appt.

## 2011-05-06 ENCOUNTER — Emergency Department (HOSPITAL_COMMUNITY)
Admission: EM | Admit: 2011-05-06 | Discharge: 2011-05-06 | Disposition: A | Discharge status: Home / Self Care | Payer: BC Managed Care – PPO | Attending: Emergency Medicine | Admitting: Emergency Medicine

## 2011-05-06 DIAGNOSIS — R059 Cough, unspecified: Secondary | ICD-10-CM | POA: Insufficient documentation

## 2011-05-06 DIAGNOSIS — J45901 Unspecified asthma with (acute) exacerbation: Secondary | ICD-10-CM | POA: Insufficient documentation

## 2011-05-06 DIAGNOSIS — R05 Cough: Secondary | ICD-10-CM | POA: Insufficient documentation

## 2011-05-06 DIAGNOSIS — R0602 Shortness of breath: Secondary | ICD-10-CM | POA: Insufficient documentation

## 2011-05-06 DIAGNOSIS — Z21 Asymptomatic human immunodeficiency virus [HIV] infection status: Secondary | ICD-10-CM | POA: Insufficient documentation

## 2011-05-15 ENCOUNTER — Ambulatory Visit (INDEPENDENT_AMBULATORY_CARE_PROVIDER_SITE_OTHER): Payer: BC Managed Care – PPO | Admitting: *Deleted

## 2011-05-15 VITALS — BP 127/95 | HR 88 | Temp 98.4°F | Resp 16 | Ht 68.0 in | Wt 232.5 lb

## 2011-05-15 DIAGNOSIS — B20 Human immunodeficiency virus [HIV] disease: Secondary | ICD-10-CM

## 2011-05-15 LAB — COMPREHENSIVE METABOLIC PANEL
Alkaline Phosphatase: 92 U/L (ref 39–117)
CO2: 28 mEq/L (ref 19–32)
Creat: 0.92 mg/dL (ref 0.50–1.35)
Glucose, Bld: 101 mg/dL — ABNORMAL HIGH (ref 70–99)
Sodium: 138 mEq/L (ref 135–145)
Total Bilirubin: 0.5 mg/dL (ref 0.3–1.2)
Total Protein: 7.2 g/dL (ref 6.0–8.3)

## 2011-05-15 LAB — LIPID PANEL
Cholesterol: 176 mg/dL (ref 0–200)
HDL: 36 mg/dL — ABNORMAL LOW (ref >39)
LDL Cholesterol: 107 mg/dL — ABNORMAL HIGH (ref 0–99)
Total CHOL/HDL Ratio: 4.9 Ratio
Triglycerides: 166 mg/dL — ABNORMAL HIGH (ref <150)
VLDL: 33 mg/dL (ref 0–40)

## 2011-05-15 NOTE — Progress Notes (Signed)
  Subjective:    Patient ID: Anthony Rich, male    DOB: March 28, 1971, 40 y.o.   MRN: [id number]  HPI    Review of Systems  Constitutional: Negative.   HENT: Negative.   Eyes: Negative.   Respiratory: Negative.  Negative for cough, shortness of breath and wheezing.   Cardiovascular: Negative.   Gastrointestinal: Negative.   Genitourinary: Negative.   Musculoskeletal: Negative.   Neurological: Negative.   Psychiatric/Behavioral: Negative.        Objective:   Physical Exam  Constitutional: He is oriented to person, place, and time. Vital signs are normal. He appears well-developed.  HENT:  Mouth/Throat: Uvula is midline, oropharynx is clear and moist and mucous membranes are normal.  Neck: Normal range of motion.  Cardiovascular: Normal rate, regular rhythm, S1 normal, S2 normal and intact distal pulses.   Pulmonary/Chest: Effort normal and breath sounds normal. No respiratory distress. He has no wheezes.  Abdominal: Soft. Normal appearance. Bowel sounds are decreased. There is no hepatomegaly.  Lymphadenopathy:       Head (right side): No preauricular, no posterior auricular and no occipital adenopathy present.       Head (left side): No preauricular, no posterior auricular and no occipital adenopathy present.    He has no cervical adenopathy.       Right: No supraclavicular adenopathy present.       Left: No supraclavicular adenopathy present.  Neurological: He is alert and oriented to person, place, and time.  Skin: Skin is warm, dry and intact.          Assessment & Plan:  Patient here for screening visit for A5293 ACTG study on lipids and inflammation. Informed consent obtained and all questions answered. If eligible he will be randomized to either fenofibrate or niaspan. He denies any current problems. He was seen in the ED around the first of November for asthma. He said he was given a breathing treatment and got relieved. He never got a prednisone prescription filled  that they gave him to take afterwards. He denies any SOB, cough or wheezing now.

## 2011-05-17 LAB — HIV-1 RNA QUANT-NO REFLEX-BLD: HIV 1 RNA Quant: NOT DETECTED copies/mL (ref <20)

## 2011-05-22 ENCOUNTER — Encounter: Payer: Self-pay | Admitting: Infectious Diseases

## 2011-05-22 LAB — CD4/CD8 (T-HELPER/T-SUPPRESSOR CELL)
CD4%: 34.6
CD4: 588
CD8 % Suppressor T Cell: 27.2
CD8: 462

## 2011-05-22 LAB — HIV-1 RNA QUANT-NO REFLEX-BLD: HIV-1 RNA Viral Load: <40

## 2011-06-22 ENCOUNTER — Ambulatory Visit: Payer: BC Managed Care – PPO | Admitting: Infectious Diseases

## 2011-07-02 ENCOUNTER — Encounter (HOSPITAL_COMMUNITY): Payer: Self-pay | Admitting: *Deleted

## 2011-07-02 ENCOUNTER — Emergency Department (HOSPITAL_COMMUNITY)
Admission: EM | Admit: 2011-07-02 | Discharge: 2011-07-02 | Disposition: A | Discharge status: Home / Self Care | Payer: BC Managed Care – PPO | Attending: Emergency Medicine | Admitting: Emergency Medicine

## 2011-07-02 DIAGNOSIS — R0602 Shortness of breath: Secondary | ICD-10-CM | POA: Insufficient documentation

## 2011-07-02 DIAGNOSIS — R05 Cough: Secondary | ICD-10-CM | POA: Insufficient documentation

## 2011-07-02 DIAGNOSIS — J45901 Unspecified asthma with (acute) exacerbation: Secondary | ICD-10-CM | POA: Insufficient documentation

## 2011-07-02 DIAGNOSIS — R059 Cough, unspecified: Secondary | ICD-10-CM | POA: Insufficient documentation

## 2011-07-02 MED ORDER — ALBUTEROL SULFATE HFA 108 (90 BASE) MCG/ACT IN AERS
2.0000 | INHALATION_SPRAY | RESPIRATORY_TRACT | Status: DC | PRN
  Administered 2011-07-02: 2 via RESPIRATORY_TRACT
  Filled 2011-07-02: qty 6.7

## 2011-07-02 MED ORDER — ALBUTEROL SULFATE (5 MG/ML) 0.5% IN NEBU
INHALATION_SOLUTION | RESPIRATORY_TRACT | Status: AC
  Administered 2011-07-02: 5 mg via RESPIRATORY_TRACT
  Filled 2011-07-02: qty 1

## 2011-07-02 MED ORDER — IPRATROPIUM BROMIDE 0.02 % IN SOLN
RESPIRATORY_TRACT | Status: AC
  Administered 2011-07-02: 0.5 mg via RESPIRATORY_TRACT
  Filled 2011-07-02: qty 2.5

## 2011-07-02 MED ORDER — PREDNISONE 20 MG PO TABS
60.0000 mg | ORAL_TABLET | Freq: Once | ORAL | Status: AC
  Administered 2011-07-02: 60 mg via ORAL
  Filled 2011-07-02: qty 3

## 2011-07-02 MED ORDER — IPRATROPIUM BROMIDE 0.02 % IN SOLN
0.5000 mg | Freq: Once | RESPIRATORY_TRACT | Status: AC
  Administered 2011-07-02: 0.5 mg via RESPIRATORY_TRACT

## 2011-07-02 MED ORDER — ALBUTEROL SULFATE (5 MG/ML) 0.5% IN NEBU
5.0000 mg | INHALATION_SOLUTION | Freq: Once | RESPIRATORY_TRACT | Status: DC
  Administered 2011-07-02: 5 mg via RESPIRATORY_TRACT

## 2011-07-02 MED ORDER — ALBUTEROL SULFATE (5 MG/ML) 0.5% IN NEBU
5.0000 mg | INHALATION_SOLUTION | Freq: Once | RESPIRATORY_TRACT | Status: AC
  Administered 2011-07-02: 5 mg via RESPIRATORY_TRACT
  Filled 2011-07-02: qty 1

## 2011-07-02 MED ORDER — ALBUTEROL SULFATE HFA 108 (90 BASE) MCG/ACT IN AERS: 1.0000 | INHALATION_SPRAY | Freq: Four times a day (QID) | RESPIRATORY_TRACT | Status: DC | PRN

## 2011-07-02 MED ORDER — PREDNISONE 50 MG PO TABS: ORAL_TABLET | ORAL | Status: AC

## 2011-07-02 NOTE — ED Notes (Signed)
Pt. Alert and oriented, discharged to home pt. Ambulatory gait steady, respirations even and regular

## 2011-07-02 NOTE — ED Provider Notes (Signed)
History     CSN: [id number]  Arrival date & time 07/02/11  0118   First MD Initiated Contact with Patient 07/02/11 0135      Chief Complaint  Patient presents with  . Shortness of Breath     Patient is a 40 y.o. male presenting with shortness of breath. The history is provided by the patient.  Shortness of Breath  The current episode started yesterday. The problem occurs continuously. The problem has been gradually worsening. The problem is moderate. The symptoms are relieved by nothing. The symptoms are aggravated by nothing. Associated symptoms include cough, shortness of breath and wheezing. Pertinent negatives include no chest pain, no chest pressure and no fever. His past medical history is significant for asthma.  denies vomiting/diarrhea/headache Reports this is similar to prior episodes of asthma Reports he ran out of his inhaler He is a nonsmoker No h/o intubations as an adult Past Medical History  Diagnosis Date  . Asthma     History reviewed. No pertinent past surgical history.  History reviewed. No pertinent family history.  History  Substance Use Topics  . Smoking status: Never Smoker   . Smokeless tobacco: Not on file  . Alcohol Use: No      Review of Systems  Constitutional: Negative for fever.  Respiratory: Positive for cough, shortness of breath and wheezing.   Cardiovascular: Negative for chest pain.  All other systems reviewed and are negative.    Allergies  Review of patient's allergies indicates not on file.  Home Medications   Current Outpatient Rx  Name Route Sig Dispense Refill  . ABACAVIR SULFATE-LAMIVUDINE 600-300 MG PO TABS Oral Take 1 tablet by mouth daily.      . EFAVIRENZ 600 MG PO TABS Oral Take 600 mg by mouth at bedtime.      Marland Kitchen MONTELUKAST SODIUM 10 MG PO TABS Oral Take 10 mg by mouth at bedtime.        BP 192/95  Pulse 129  Temp(Src) 98.4 F (36.9 C) (Oral)  Resp 32  SpO2 97%  Physical Exam  CONSTITUTIONAL: Well  developed/well nourished HEAD AND FACE: Normocephalic/atraumatic EYES: EOMI/PERRL ENMT: Mucous membranes moist NECK: supple no meningeal signs SPINE:entire spine nontender CV: S1/S2 noted, no murmurs/rubs/gallops noted LUNGS: tachypneic, wheezing bilaterally with minimal air movement ABDOMEN: soft, nontender, no rebound or guarding GU:no cva tenderness NEURO: Pt is awake/alert, moves all extremitiesx4 EXTREMITIES: pulses normal, full ROM, no edema SKIN: warm, color normal PSYCH: no abnormalities of mood noted   ED Course  Procedures   1:44 AM Pt with h/o HIV but last CD4>500 on last check earlier this fall Will treat with nebs and reassess, likely asthma exacerbation, pt reports similar to prior episodes  2:39 AM Pt improved, BP is improved, HR improved He still has some wheeze will give another neb  4:09 AM Pt improved, ambulatory, no hypoxia, no tachypnea, stable for d/c  MDM  Nursing notes reviewed and considered in documentation Previous records reviewed and considered         Joya Gaskins, MD 07/02/11 [phone]

## 2011-07-02 NOTE — ED Notes (Signed)
The pt has had difficulty breathing for the past 2 days .  He has a history of asthma  And he has been out of his inhaler for a few days

## 2011-07-02 NOTE — ED Notes (Signed)
Received pt. From Triage via w/c with SOB, Pt. Alert and oriented, pt. To stretcher O2 2L N/C applied

## 2011-07-12 ENCOUNTER — Encounter: Payer: Self-pay | Admitting: *Deleted

## 2011-07-12 ENCOUNTER — Ambulatory Visit (INDEPENDENT_AMBULATORY_CARE_PROVIDER_SITE_OTHER): Payer: BC Managed Care – PPO | Admitting: *Deleted

## 2011-07-12 VITALS — BP 126/88 | HR 82 | Temp 97.8°F | Resp 16 | Wt 243.5 lb

## 2011-07-12 DIAGNOSIS — B20 Human immunodeficiency virus [HIV] disease: Secondary | ICD-10-CM

## 2011-07-12 MED ORDER — ASPIRIN 325 MG PO TABS: 325.0000 mg | ORAL_TABLET | Freq: Every day | ORAL | Status: DC

## 2011-07-12 MED ORDER — NIACIN ER (ANTIHYPERLIPIDEMIC) 500 MG PO TBCR: 500.0000 mg | EXTENDED_RELEASE_TABLET | Freq: Every day | ORAL | Status: DC

## 2011-07-12 NOTE — Progress Notes (Signed)
Patient is here today for entry into A5293, a lipid and inflammation study. He was randomized to the niaspan and aspirin arm of the study and plans to start the meds this evening. Dosing, side effects and when to call for problems were discussed with him. He was also given a diet and activity guide supplied by the study and the prohibited med list. He denies any current symptoms or other problems. He will return in 4 weeks for the next visit. And is to call us if he has any problems taking the medication. We discussed the possibility of flushing and how to cope with the side effects.

## 2011-07-13 LAB — BASIC METABOLIC PANEL
CO2: 24 mEq/L (ref 19–32)
Calcium: 9.1 mg/dL (ref 8.4–10.5)
Sodium: 140 mEq/L (ref 135–145)

## 2011-07-14 ENCOUNTER — Telehealth: Payer: Self-pay | Admitting: *Deleted

## 2011-07-14 DIAGNOSIS — B20 Human immunodeficiency virus [HIV] disease: Secondary | ICD-10-CM

## 2011-07-14 MED ORDER — EFAVIRENZ 600 MG PO TABS: 600.0000 mg | ORAL_TABLET | Freq: Every day | ORAL | Status: DC

## 2011-07-14 MED ORDER — ABACAVIR SULFATE-LAMIVUDINE 600-300 MG PO TABS: 1.0000 | ORAL_TABLET | Freq: Every day | ORAL | Status: DC

## 2011-07-14 NOTE — Telephone Encounter (Signed)
Patient called from the pharmacy and said he was out of refills on his HIV meds.

## 2011-07-26 ENCOUNTER — Encounter: Payer: Self-pay | Admitting: Infectious Diseases

## 2011-07-26 LAB — CD4/CD8 (T-HELPER/T-SUPPRESSOR CELL)
CD4: 421
CD8 % Suppressor T Cell: 26.7
CD8: 294

## 2011-08-01 ENCOUNTER — Encounter: Payer: Self-pay | Admitting: Infectious Diseases

## 2011-08-07 ENCOUNTER — Ambulatory Visit (INDEPENDENT_AMBULATORY_CARE_PROVIDER_SITE_OTHER): Payer: BC Managed Care – PPO | Admitting: *Deleted

## 2011-08-07 ENCOUNTER — Other Ambulatory Visit: Payer: Self-pay | Admitting: Infectious Diseases

## 2011-08-07 VITALS — HR 80 | Temp 97.4°F | Resp 18 | Wt 240.0 lb

## 2011-08-07 DIAGNOSIS — B20 Human immunodeficiency virus [HIV] disease: Secondary | ICD-10-CM

## 2011-08-07 LAB — CBC WITH DIFFERENTIAL/PLATELET
Basophils Absolute: 0 10*3/uL (ref 0.0–0.1)
Eosinophils Absolute: 0.3 10*3/uL (ref 0.0–0.7)
Lymphocytes Relative: 16 % (ref 12–46)
Lymphs Abs: 1.4 10*3/uL (ref 0.7–4.0)
Neutrophils Relative %: 71 % (ref 43–77)
Platelets: 309 10*3/uL (ref 150–400)
RBC: 5.21 MIL/uL (ref 4.22–5.81)
WBC: 8.6 10*3/uL (ref 4.0–10.5)

## 2011-08-07 LAB — COMPREHENSIVE METABOLIC PANEL
ALT: 53 U/L (ref 0–53)
AST: 29 U/L (ref 0–37)
Alkaline Phosphatase: 94 U/L (ref 39–117)
Creat: 0.9 mg/dL (ref 0.50–1.35)
Total Bilirubin: 0.2 mg/dL — ABNORMAL LOW (ref 0.3–1.2)

## 2011-08-07 LAB — LIPID PANEL: Total CHOL/HDL Ratio: 6.5 Ratio

## 2011-08-07 LAB — URIC ACID: Uric Acid, Serum: 5.9 mg/dL (ref 4.0–7.8)

## 2011-08-07 NOTE — Progress Notes (Signed)
Patient here for his 4 week A5293 study visit and his regular ALLRT visit. He denies any current problems. He says he has been very adherent with his study meds and has taken them every day. He has not noticed any flushing, muscle aches, rash or other side effects of the meds. He was instructed to increase his dose of niaspan to 2 tabs or 1000mg  and to continue taking the aspirin 30 min. before the niaspan in the evening. He will return in 4 weeks for the next visit.

## 2011-08-08 NOTE — Progress Notes (Signed)
Addended by: Armida Sans on: 08/08/2011 11:24 AM   Modules accepted: Orders

## 2011-08-14 ENCOUNTER — Telehealth: Payer: Self-pay | Admitting: Infectious Diseases

## 2011-08-14 NOTE — Telephone Encounter (Signed)
[name] came by wanting a new Epzicom co-pay card.  I told him they should be here by Friday.  I will call him as soon as we receive them.

## 2011-08-17 ENCOUNTER — Telehealth: Payer: Self-pay | Admitting: Infectious Diseases

## 2011-08-17 NOTE — Telephone Encounter (Signed)
I called [name] and told him the co-pay cards for Epzicom have come in.

## 2011-09-04 ENCOUNTER — Ambulatory Visit (INDEPENDENT_AMBULATORY_CARE_PROVIDER_SITE_OTHER): Payer: BC Managed Care – PPO | Admitting: *Deleted

## 2011-09-04 VITALS — BP 111/74 | HR 88 | Temp 98.2°F | Resp 18 | Wt 238.0 lb

## 2011-09-04 DIAGNOSIS — B20 Human immunodeficiency virus [HIV] disease: Secondary | ICD-10-CM

## 2011-09-04 NOTE — Progress Notes (Signed)
Pt here for A5293 study, week 8. Pt stated doing well. Stated he had a skin rash that showed up last week but has since cleared up. Pt denies any itching, pain to area. Pt adhering to his niacin and asa regimen. Stated he missed 1 dose since last study visit. Pill count revealed #6 tablets left of Niacin and #3 tablets left of the asa. No blood drawn this visit. VSS; Study medications were dispensed. Pt received $45 gift card for visit. Also give patient discount cards for efavirenz and epzicom. Next appointment scheduled for Monday, October 02, 2011 @ 10am. Plan to have Brachial FMD performed Tuesday, October 03, 2011 @ 9:30am. Have sent email to [facility] to confirm. Tacey Heap RN

## 2011-10-02 ENCOUNTER — Ambulatory Visit (INDEPENDENT_AMBULATORY_CARE_PROVIDER_SITE_OTHER): Payer: BC Managed Care – PPO | Admitting: *Deleted

## 2011-10-02 VITALS — BP 112/80 | HR 96 | Temp 97.9°F | Resp 16 | Wt 237.0 lb

## 2011-10-02 DIAGNOSIS — B20 Human immunodeficiency virus [HIV] disease: Secondary | ICD-10-CM

## 2011-10-02 DIAGNOSIS — Z21 Asymptomatic human immunodeficiency virus [HIV] infection status: Secondary | ICD-10-CM

## 2011-10-02 LAB — POCT URINALYSIS DIPSTICK
Blood, UA: NEGATIVE
Glucose, UA: NEGATIVE
Ketones, UA: 40
Spec Grav, UA: >=1.03
pH, UA: 5.5

## 2011-10-02 LAB — COMPREHENSIVE METABOLIC PANEL
AST: 22 U/L (ref 0–37)
Albumin: 4.4 g/dL (ref 3.5–5.2)
Alkaline Phosphatase: 88 U/L (ref 39–117)
BUN: 14 mg/dL (ref 6–23)
Potassium: 4 mEq/L (ref 3.5–5.3)
Sodium: 139 mEq/L (ref 135–145)
Total Bilirubin: 0.5 mg/dL (ref 0.3–1.2)

## 2011-10-02 LAB — CBC WITH DIFFERENTIAL/PLATELET
Eosinophils Absolute: 0.1 10*3/uL (ref 0.0–0.7)
Eosinophils Relative: 2 % (ref 0–5)
HCT: 47.6 % (ref 39.0–52.0)
Lymphocytes Relative: 12 % (ref 12–46)
Lymphs Abs: 0.8 10*3/uL (ref 0.7–4.0)
MCH: 27.2 pg (ref 26.0–34.0)
MCV: 84 fL (ref 78.0–100.0)
Monocytes Absolute: 0.4 10*3/uL (ref 0.1–1.0)
Monocytes Relative: 6 % (ref 3–12)
Platelets: 318 10*3/uL (ref 150–400)
RBC: 5.67 MIL/uL (ref 4.22–5.81)

## 2011-10-02 NOTE — Progress Notes (Signed)
Patient here for week 12 study visit. He denies any new problems and continues to be very adherent with his study meds. Urine obtained for stored specimen, but decided to run U/A on it. It was orange and cloudy. Bilrubin and ketones were up. He is scheduled for the BART exam tomorrow morning at [facility]. He will return in 4 weeks for the next research appt.

## 2011-10-12 ENCOUNTER — Encounter: Payer: Self-pay | Admitting: Internal Medicine

## 2011-10-12 ENCOUNTER — Encounter: Payer: Self-pay | Admitting: *Deleted

## 2011-10-12 ENCOUNTER — Ambulatory Visit (INDEPENDENT_AMBULATORY_CARE_PROVIDER_SITE_OTHER): Payer: BC Managed Care – PPO | Admitting: Internal Medicine

## 2011-10-12 ENCOUNTER — Telehealth: Payer: Self-pay | Admitting: *Deleted

## 2011-10-12 DIAGNOSIS — B20 Human immunodeficiency virus [HIV] disease: Secondary | ICD-10-CM

## 2011-10-12 DIAGNOSIS — J45909 Unspecified asthma, uncomplicated: Secondary | ICD-10-CM

## 2011-10-12 MED ORDER — ALBUTEROL SULFATE (5 MG/ML) 0.5% IN NEBU
2.5000 mg | INHALATION_SOLUTION | Freq: Once | RESPIRATORY_TRACT | Status: AC
  Administered 2011-10-12: 2.5 mg via RESPIRATORY_TRACT

## 2011-10-12 NOTE — Telephone Encounter (Signed)
States he has had problems breathing for the past 2 days. States he might have had a fever yesterday. Denies cough or mucus. Concerned & wants to see a doctor. appt for 9:45 this am with Dr. Drue Second

## 2011-10-16 NOTE — Progress Notes (Signed)
HIV CLINIC- SICK VISIT  RFV = 2 days of shortness of breath and wheezing  Subjective:    Patient ID: Anthony Rich, male    DOB: 09/02/1970, 41 y.o.   MRN: [id number]  HPI [name] is a 41yo Male with HIV, enrolled in clinical trial, currently on abacavir/lamivudine/EFV. He reports having 2 days of increasing shortness of breath. He denies productive cough, but has intermittent dry cough. He has been using his inhaler as directed both symbicort and albuterol,but thinks his albuterol inhaler is running out. He states that his dyspnea is not worsened when walking. He denies fever/chills/runny nose/sore throat/ no n/v/d. No sick contacts. 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 He has not missed worked until today.  Prior to Admission medications   Medication Sig Start Date End Date Taking? Authorizing Provider  abacavir-lamiVUDine (EPZICOM) 600-300 MG per tablet Take 1 tablet by mouth daily. 07/14/11   Ginnie Smart, MD  albuterol (PROVENTIL HFA;VENTOLIN HFA) 108 (90 BASE) MCG/ACT inhaler Inhale 2 puffs into the lungs every 6 (six) hours as needed. wheezing     Historical Provider, MD  albuterol (PROVENTIL HFA;VENTOLIN HFA) 108 (90 BASE) MCG/ACT inhaler Inhale 1-2 puffs into the lungs every 6 (six) hours as needed for wheezing. 07/02/11 07/01/12  Joya Gaskins, MD  aspirin (BAYER ASPIRIN) 325 MG tablet Take 1 tablet (325 mg total) by mouth daily. 07/12/11 07/11/12  Ginnie Smart, MD  budesonide-formoterol [facility]) 160-4.5 MCG/ACT inhaler Inhale 2 puffs into the lungs 2 (two) times daily.      Historical Provider, MD  efavirenz (SUSTIVA) 600 MG tablet Take 1 tablet (600 mg total) by mouth at bedtime. 07/14/11   Ginnie Smart, MD  montelukast (SINGULAIR) 10 MG tablet Take 10 mg by mouth at bedtime.      Historical Provider, MD  niacin (NIASPAN) 500 MG CR tablet Take 1 tablet (500 mg total) by mouth at bedtime. 07/12/11 07/11/12  Ginnie Smart, MD   Active Ambulatory Problems    Diagnosis Date Noted  . HIV DISEASE 02/20/2003  . ACUTE BRONCHITIS 04/11/2010  . ASTHMA 04/12/2006  . DENTAL CARIES 08/11/2008  . RECTAL BLEEDING 10/28/2007  . XERODERMA 08/11/2008  . RASH AND OTHER NONSPECIFIC SKIN ERUPTION 05/27/2007  . PERIPHERAL EDEMA 06/02/2010   Resolved Ambulatory Problems    Diagnosis Date Noted  . No Resolved Ambulatory Problems   Past Medical History  Diagnosis Date  . Asthma      Review of Systems Review of Systems  Constitutional: Negative for fever, chills, diaphoresis, activity change, appetite change, fatigue and unexpected weight change.  HENT: Negative for congestion, sore throat, rhinorrhea, sneezing, trouble swallowing and sinus pressure.  Eyes: Negative for photophobia and visual disturbance.  Respiratory: positive  For intermittent dry cough, occ. shortness of breath, wheezing but no chesttightness or stridor.  Cardiovascular: Negative for chest pain, palpitations and leg swelling.  Gastrointestinal: Negative for nausea, vomiting, abdominal pain, diarrhea, constipation, blood in stool, abdominal distention and anal bleeding.  Genitourinary: Negative for dysuria, hematuria, flank pain and difficulty urinating. He feels that he maybe having fungal infection of groin since scrotum is "itching" similar to the past. Musculoskeletal: Negative for myalgias, back pain, joint swelling, arthralgias and gait problem.  Skin: Negative for color change, pallor, rash and wound.  Neurological: Negative for dizziness, tremors, weakness and light-headedness.  Hematological: Negative for adenopathy. Does not bruise/bleed easily.  Psychiatric/Behavioral: Negative for behavioral  problems, confusion, sleep disturbance, dysphoric mood, decreased concentration and agitation.       Objective:   Physical Exam  BP 136/89  Pulse 96  Temp(Src) 97.6 F (36.4 C) (Oral)  Wt 235 lb (106.595 kg)  SpO2 97%  General  Appearance:    Alert, cooperative, no distress, appears stated age  Head:    Normocephalic, without obvious abnormality, atraumatic  Eyes:    PERRL, conjunctiva/corneas clear, EOM's intact, fundi    benign, both eyes       Ears:    Normal TM's and external ear canals, both ears  Nose:   Nares normal, septum midline, mucosa normal, no drainage   or sinus tenderness  Throat:   Lips, mucosa, and tongue normal; teeth and gums normal  Neck:   Supple, symmetrical, trachea midline, no adenopathy;        Back:     Symmetric, no curvature, ROM normal, no CVA tenderness  Lungs:     Clear to auscultation bilaterally, except mild expiratory wheeze at left base, otherwise respirations unlabored     Heart:    Regular rate and rhythm, S1 and S2 normal, no murmur, rub   or gallop  Abdomen:     Soft, non-tender, bowel sounds active all four quadrants,    no masses, no organomegaly  Genitalia:    Normal male without lesion, discharge or tenderness; unable to discern if patient having superficial fungal infection     Extremities:   Extremities normal, atraumatic, no cyanosis or edema  Pulses:   2+ and symmetric all extremities  Skin:   Skin color, texture, turgor normal, no rashes or lesions  Lymph nodes:   Cervical, supraclavicular, and axillary nodes normal  Neurologic:   CNII-XII intact. Normal strength, sensation and reflexes      throughout        Assessment & Plan:  Reactive airway disease= will do a trial of albuterol inhaler in the clinic to see if it improves his symptoms. He states that he may be running out of his inhaler/ he states he is enrolled in a study and will be getting another inhaler today. Will not prescribe a course of antibiotics at this time since it appears to be more likely acute asthma rather than acute bronchitis. If symptoms do not improve, may need a course of treatment.  After receiving albuterol nebulizer, patient's pulmonary exam did not have evidence of wheezing and  patient felt symptomatically improved.  Hiv= continue on HIV medications and follow up with study visits  Possible tinea cruris = will do a trial of OTC, lotrimin to apply to affected area BID until improved. To come back if not improved in 10 days.  * greater than 45 min spent evaluating patient, treating with nebulizer.

## 2011-10-30 ENCOUNTER — Ambulatory Visit (INDEPENDENT_AMBULATORY_CARE_PROVIDER_SITE_OTHER): Payer: Self-pay | Admitting: *Deleted

## 2011-10-30 VITALS — BP 136/86 | HR 88 | Temp 97.8°F | Resp 16 | Wt 240.5 lb

## 2011-10-30 DIAGNOSIS — B20 Human immunodeficiency virus [HIV] disease: Secondary | ICD-10-CM

## 2011-10-30 NOTE — Progress Notes (Signed)
Patient here for his 16 week study visit for A5293. He is tolerating his 1500mg  niacin without problem. Denies any muscle aches or new problems. He will return in 8 weeks for the final study visit.

## 2011-11-01 ENCOUNTER — Encounter: Payer: Self-pay | Admitting: *Deleted

## 2011-12-21 ENCOUNTER — Ambulatory Visit: Payer: Self-pay

## 2011-12-22 ENCOUNTER — Ambulatory Visit (INDEPENDENT_AMBULATORY_CARE_PROVIDER_SITE_OTHER): Payer: BC Managed Care – PPO | Admitting: *Deleted

## 2011-12-22 ENCOUNTER — Other Ambulatory Visit: Payer: Self-pay | Admitting: *Deleted

## 2011-12-22 VITALS — BP 104/76 | HR 76 | Temp 98.0°F | Resp 18 | Wt 236.2 lb

## 2011-12-22 DIAGNOSIS — B20 Human immunodeficiency virus [HIV] disease: Secondary | ICD-10-CM

## 2011-12-22 LAB — COMPREHENSIVE METABOLIC PANEL
AST: 31 U/L (ref 0–37)
Albumin: 4.2 g/dL (ref 3.5–5.2)
BUN: 11 mg/dL (ref 6–23)
CO2: 27 mEq/L (ref 19–32)
Calcium: 9.5 mg/dL (ref 8.4–10.5)
Chloride: 102 mEq/L (ref 96–112)
Creat: 0.91 mg/dL (ref 0.50–1.35)
Potassium: 3.9 mEq/L (ref 3.5–5.3)

## 2011-12-22 LAB — URIC ACID: Uric Acid, Serum: 6.1 mg/dL (ref 4.0–7.8)

## 2011-12-22 NOTE — Progress Notes (Signed)
  Subjective:    Patient ID: Anthony Rich, male    DOB: 1971-06-23, 41 y.o.   MRN: [id number]  HPI    Review of Systems  Constitutional: Negative.   HENT: Negative.   Respiratory: Negative.   Cardiovascular: Negative.   Gastrointestinal: Negative.   Genitourinary: Negative.   Musculoskeletal: Negative.   Skin: Negative.   Neurological: Negative.   Psychiatric/Behavioral: Negative.        Objective:   Physical Exam  Constitutional: Vital signs are normal. He appears well-developed.  Cardiovascular: Normal rate, regular rhythm, normal heart sounds and intact distal pulses.   Pulmonary/Chest: Effort normal and breath sounds normal.  Abdominal: Soft. Bowel sounds are normal. There is no tenderness.  Lymphadenopathy:    He has no cervical adenopathy.  Skin: Skin is warm, dry and intact.   Patient has +1 edema in both feet. Denies any current problems.       Assessment & Plan:  Patient here for his final A5293 visit. He denies any current problems. He has his BART exam scheduled for this Tuesday at 10am at [facility]. He will continue to be followed in the new study A5321 after the ALLRT study closes in September, and he will be getting the HIV labs drawn every 24 weeks on that study.

## 2012-01-09 ENCOUNTER — Encounter: Payer: Self-pay | Admitting: Infectious Diseases

## 2012-01-09 LAB — CD4/CD8 (T-HELPER/T-SUPPRESSOR CELL)
CD4%: 39.9
CD8 % Suppressor T Cell: 25.8

## 2012-01-09 LAB — HIV-1 RNA QUANT-NO REFLEX-BLD: HIV-1 RNA Viral Load: <40

## 2012-02-08 ENCOUNTER — Encounter: Payer: Self-pay | Admitting: *Deleted

## 2012-02-08 ENCOUNTER — Ambulatory Visit (INDEPENDENT_AMBULATORY_CARE_PROVIDER_SITE_OTHER): Payer: BC Managed Care – PPO | Admitting: *Deleted

## 2012-02-08 VITALS — BP 120/78 | HR 93 | Temp 97.5°F | Wt 233.0 lb

## 2012-02-08 DIAGNOSIS — B20 Human immunodeficiency virus [HIV] disease: Secondary | ICD-10-CM

## 2012-02-08 LAB — COMPREHENSIVE METABOLIC PANEL
ALT: 84 U/L — ABNORMAL HIGH (ref 0–53)
AST: 35 U/L (ref 0–37)
Calcium: 9.8 mg/dL (ref 8.4–10.5)
Chloride: 99 mEq/L (ref 96–112)
Creat: 1.07 mg/dL (ref 0.50–1.35)

## 2012-02-08 LAB — LIPID PANEL
LDL Cholesterol: 105 mg/dL — ABNORMAL HIGH (ref 0–99)
Total CHOL/HDL Ratio: 5 Ratio
VLDL: 40 mg/dL (ref 0–40)

## 2012-02-08 NOTE — Progress Notes (Signed)
This is the last ALLRT study visit for [facility]. The only complaint he has  currently is itching in the groin area which he has complained of before. He is going to call his primary care physician for a refill for ointment he has used for the same problem. He is eligible for the viral reservoirs study which will open this FALL. He is interested in the study.

## 2012-02-09 LAB — PROTEIN, URINE, RANDOM: Total Protein, Urine: 22 mg/dL

## 2012-02-09 LAB — CREATININE, URINE, RANDOM: Creatinine, Urine: 424.1 mg/dL

## 2012-02-28 LAB — CD4/CD8 (T-HELPER/T-SUPPRESSOR CELL): CD4: 481

## 2012-02-29 LAB — CD4/CD8 (T-HELPER/T-SUPPRESSOR CELL)
CD4 % Helper T Cell: 40.1
CD4/CD8 Ratio: 1.42
CD4: 481
CD8: 338

## 2012-02-29 LAB — HIV-1 RNA QUANT-NO REFLEX-BLD: HIV 1 RNA Quant: <40

## 2012-03-11 ENCOUNTER — Encounter: Payer: Self-pay | Admitting: Infectious Diseases

## 2012-04-11 ENCOUNTER — Telehealth: Payer: Self-pay | Admitting: *Deleted

## 2012-04-11 NOTE — Telephone Encounter (Signed)
Patient came to clinic to advise that he has had sever pain in his feet and ankles for the past 3 days. He advised her has no sores, no injuries and he has been standing more at work than usual. Advised her wants to see the doctor if at all possible. He also advised is worried he may be diabetic. Advised him that the next available appt we have is 04/16/12 at 4 pm with Dr Daiva Eves. He accepted the appt. I advised him that if the pain becomes to unbarable, and extreme swelling or weeping he needs to go to the emergency department. He acknowledged that he understands.

## 2012-04-16 ENCOUNTER — Ambulatory Visit: Payer: BC Managed Care – PPO | Admitting: Infectious Disease

## 2012-06-03 ENCOUNTER — Encounter: Payer: Self-pay | Admitting: *Deleted

## 2012-06-03 ENCOUNTER — Ambulatory Visit (INDEPENDENT_AMBULATORY_CARE_PROVIDER_SITE_OTHER): Payer: BC Managed Care – PPO | Admitting: *Deleted

## 2012-06-03 VITALS — BP 118/82 | HR 86 | Temp 98.6°F | Ht 67.0 in | Wt 229.5 lb

## 2012-06-03 DIAGNOSIS — B20 Human immunodeficiency virus [HIV] disease: Secondary | ICD-10-CM

## 2012-06-03 NOTE — Progress Notes (Signed)
Patient screened today for study A5321. He is eligible because he was in A5276 the prior viral decay study. Informed consent was obtained per SOP guidelines. He had his flushot at work in October. Currently he denies any complaints or problems. We will enroll him in the study in a few weeks.

## 2012-06-03 NOTE — Progress Notes (Signed)
  Subjective:    Patient ID: Anthony Rich, male    DOB: 22-Nov-1970, 41 y.o.   MRN: [id number]  HPI    Review of Systems     Objective:   Physical Exam  Constitutional: He is oriented to person, place, and time. He appears well-developed.  HENT:  Mouth/Throat: No oropharyngeal exudate.  Eyes: No scleral icterus.  Cardiovascular: Normal rate, regular rhythm, normal heart sounds, intact distal pulses and normal pulses.   Pulmonary/Chest: Effort normal and breath sounds normal. No respiratory distress.  Abdominal: Soft. Normal appearance and bowel sounds are normal. There is no hepatomegaly. There is no tenderness.  Musculoskeletal: Normal range of motion. He exhibits no edema.  Lymphadenopathy:    He has no cervical adenopathy.  Neurological: He is alert and oriented to person, place, and time.  Skin: Skin is warm and dry.  Psychiatric: He has a normal mood and affect.          Assessment & Plan:

## 2012-07-08 ENCOUNTER — Encounter: Payer: Self-pay | Admitting: Infectious Diseases

## 2012-07-09 ENCOUNTER — Ambulatory Visit (INDEPENDENT_AMBULATORY_CARE_PROVIDER_SITE_OTHER): Payer: BC Managed Care – PPO | Admitting: *Deleted

## 2012-07-09 VITALS — BP 129/83 | HR 87 | Temp 98.0°F | Wt 217.8 lb

## 2012-07-09 DIAGNOSIS — B20 Human immunodeficiency virus [HIV] disease: Secondary | ICD-10-CM

## 2012-07-09 LAB — CD4/CD8 (T-HELPER/T-SUPPRESSOR CELL)
CD4: 505
CD8 % Suppressor T Cell: 24.4
CD8: 268

## 2012-07-09 LAB — HIV-1 RNA QUANT-NO REFLEX-BLD: HIV-1 RNA Viral Load: <40

## 2012-07-09 NOTE — Progress Notes (Signed)
Pt here for Entry visit into A5321. Pt re-consented to updated version 1.1. Assessment unchanged since last study visit. Pt denies any new problems. Fasting labs were drawn with no problems. Pt received $50.00 gift card for study visit. Will call pt in June to schedule for 24 week visit. Tacey Heap RN

## 2012-07-10 LAB — COMPREHENSIVE METABOLIC PANEL
Albumin: 4.3 g/dL (ref 3.5–5.2)
BUN: 10 mg/dL (ref 6–23)
CO2: 29 mEq/L (ref 19–32)
Calcium: 9.2 mg/dL (ref 8.4–10.5)
Chloride: 102 mEq/L (ref 96–112)
Creat: 0.77 mg/dL (ref 0.50–1.35)
Glucose, Bld: 96 mg/dL (ref 70–99)

## 2012-07-15 ENCOUNTER — Other Ambulatory Visit: Payer: Self-pay | Admitting: Infectious Diseases

## 2012-08-06 ENCOUNTER — Ambulatory Visit: Payer: BC Managed Care – PPO | Admitting: Infectious Diseases

## 2012-08-08 ENCOUNTER — Ambulatory Visit (INDEPENDENT_AMBULATORY_CARE_PROVIDER_SITE_OTHER): Payer: BC Managed Care – PPO | Admitting: Infectious Diseases

## 2012-08-08 ENCOUNTER — Encounter: Payer: Self-pay | Admitting: Infectious Diseases

## 2012-08-08 ENCOUNTER — Other Ambulatory Visit: Payer: Self-pay | Admitting: Infectious Diseases

## 2012-08-08 VITALS — BP 123/74 | HR 91 | Temp 97.4°F | Ht 67.0 in | Wt 216.0 lb

## 2012-08-08 DIAGNOSIS — B20 Human immunodeficiency virus [HIV] disease: Secondary | ICD-10-CM

## 2012-08-08 DIAGNOSIS — Z23 Encounter for immunization: Secondary | ICD-10-CM

## 2012-08-08 DIAGNOSIS — R21 Rash and other nonspecific skin eruption: Secondary | ICD-10-CM

## 2012-08-08 DIAGNOSIS — J45909 Unspecified asthma, uncomplicated: Secondary | ICD-10-CM

## 2012-08-08 DIAGNOSIS — Z113 Encounter for screening for infections with a predominantly sexual mode of transmission: Secondary | ICD-10-CM

## 2012-08-08 DIAGNOSIS — Z7983 Long term (current) use of bisphosphonates: Secondary | ICD-10-CM

## 2012-08-08 LAB — RPR

## 2012-08-08 LAB — LIPID PANEL
HDL: 36 mg/dL — ABNORMAL LOW (ref >39)
Triglycerides: 127 mg/dL (ref <150)

## 2012-08-08 NOTE — Assessment & Plan Note (Signed)
Will have him eval by derm. lichenen?

## 2012-08-08 NOTE — Assessment & Plan Note (Signed)
Appears improved. He has PCP. Will f/u as needed.

## 2012-08-08 NOTE — Progress Notes (Signed)
  Subjective:    Patient ID: Anthony Rich, male    DOB: 04/28/1971, 42 y.o.   MRN: [id number]  HPI 42 yo M with HIV+, has been followed at [facility], on EPZ/EFV. Had CD4 505 (07-09-12, HIV RNA pending). Has been having itchy around his "private parts".  No problems with breathing. Has lost some weight which has helped. Has lost 25#, mostly through his work.   Review of Systems  Constitutional: Negative for appetite change and unexpected weight change.  Respiratory: Negative for cough and shortness of breath.   Gastrointestinal: Negative for diarrhea and constipation.  Genitourinary: Negative for difficulty urinating.       Objective:   Physical Exam  Constitutional: He appears well-developed and well-nourished.  HENT:  Mouth/Throat: Abnormal dentition. Dental caries present. No oropharyngeal exudate.  Eyes: EOM are normal. Pupils are equal, round, and reactive to light.  Neck: Neck supple.  Cardiovascular: Normal rate, regular rhythm and normal heart sounds.   Pulmonary/Chest: Effort normal and breath sounds normal. No respiratory distress. He has no wheezes.  Abdominal: Soft. Bowel sounds are normal. There is no tenderness.  Genitourinary:     Lymphadenopathy:    He has no cervical adenopathy.          Assessment & Plan:

## 2012-08-08 NOTE — Assessment & Plan Note (Signed)
He appears to be doing well. Awaiting his results from [facility]. Needs lipids and RPR. Will see him back in 6 months.  needs PNVX.

## 2012-08-13 ENCOUNTER — Encounter: Payer: Self-pay | Admitting: Infectious Diseases

## 2012-11-07 ENCOUNTER — Ambulatory Visit (INDEPENDENT_AMBULATORY_CARE_PROVIDER_SITE_OTHER): Payer: BC Managed Care – PPO | Admitting: *Deleted

## 2012-11-07 VITALS — BP 134/85 | HR 80 | Temp 98.1°F | Resp 16 | Ht 67.0 in | Wt 231.2 lb

## 2012-11-07 DIAGNOSIS — B20 Human immunodeficiency virus [HIV] disease: Secondary | ICD-10-CM

## 2012-11-07 DIAGNOSIS — Z21 Asymptomatic human immunodeficiency virus [HIV] infection status: Secondary | ICD-10-CM

## 2012-11-07 LAB — CBC WITH DIFFERENTIAL/PLATELET
Basophils Absolute: 0 10*3/uL (ref 0.0–0.1)
Basophils Relative: 1 % (ref 0–1)
Eosinophils Relative: 3 % (ref 0–5)
HCT: 43.4 % (ref 39.0–52.0)
MCHC: 33.4 g/dL (ref 30.0–36.0)
MCV: 81.7 fL (ref 78.0–100.0)
Monocytes Absolute: 0.6 10*3/uL (ref 0.1–1.0)
RDW: 16 % — ABNORMAL HIGH (ref 11.5–15.5)

## 2012-11-07 LAB — PROTEIN, URINE, RANDOM: Total Protein, Urine: 7 mg/dL

## 2012-11-07 LAB — COMPREHENSIVE METABOLIC PANEL
AST: 39 U/L — ABNORMAL HIGH (ref 0–37)
Alkaline Phosphatase: 92 U/L (ref 39–117)
BUN: 9 mg/dL (ref 6–23)
Creat: 0.73 mg/dL (ref 0.50–1.35)
Total Bilirubin: 0.4 mg/dL (ref 0.3–1.2)

## 2012-11-07 LAB — LIPID PANEL
HDL: 45 mg/dL (ref >39)
LDL Cholesterol: 67 mg/dL (ref 0–99)
Total CHOL/HDL Ratio: 3.2 Ratio

## 2012-11-07 LAB — HEPATITIS C ANTIBODY: HCV Ab: NEGATIVE

## 2012-11-07 NOTE — Progress Notes (Signed)
Subjective:    Patient ID: Anthony Rich is a 42 y.o. male.  Chief Complaint: [phone] study Entry visit  Data Review:   Review of Systems  Constitutional: Negative.   HENT: Negative.   Eyes: Negative.   Respiratory: Negative.   Cardiovascular: Negative.   Gastrointestinal: Negative.   Genitourinary: Negative.   Musculoskeletal: Negative.   Skin: Negative.   Neurological: Negative.   Endo/Heme/Allergies: Negative.   Psychiatric/Behavioral: Negative.     Objective:  Physical Exam  Constitutional: He is oriented to person, place, and time. He appears well-developed and well-nourished.  HENT:  Head: Normocephalic.  Neck: Normal range of motion. Neck supple.  Cardiovascular: Normal rate, regular rhythm and normal heart sounds.   Pulmonary/Chest: Effort normal. He has wheezes.  Inspiratory wheezes auscultated on dorsal side of upper left lobe.  Abdominal: Soft. Bowel sounds are normal.  Musculoskeletal: Normal range of motion.  Lymphadenopathy:    He has no cervical adenopathy.  Neurological: He is alert and oriented to person, place, and time.  Skin: Skin is warm and dry.  Psychiatric: He has a normal mood and affect. His behavior is normal. Judgment normal.    Laboratory:    Assessment:    Here for Entry into A5322. Consent reviewed and discussed with patient; answered questions and he then verbalized understanding and signed consent witnessed by me. Denies any new findings and complaints. Fasting labs were drawn with no problems; questionnaires and neuro testing completed. No deviations. He received $50.00 gift card for visit. Tacey Heap RN  Next appointments are as follow:  A5321 visit: 12/18/12 at 9:00 am A5322 visit: 05/05/13 at 9:00 am  Plan:

## 2012-11-07 NOTE — Progress Notes (Deleted)
Here for Entry into A5322. Consent reviewed and discussed with patient; answered questions and he then verbalized understanding and signed consent witnessed by me. Denies any new findings and complaints. Fasting labs were drawn with no problems; questionnaires and neuro testing completed. No deviations. He received $50.00 gift card for visit. Tacey Heap RN  Next appointments are as follow:  A5321 visit: 12/18/12 at 9:00 am A5322 visit: 05/05/13 at 9:00 am

## 2013-01-13 ENCOUNTER — Ambulatory Visit (INDEPENDENT_AMBULATORY_CARE_PROVIDER_SITE_OTHER): Payer: BC Managed Care – PPO | Admitting: *Deleted

## 2013-01-13 VITALS — BP 136/87 | HR 83 | Temp 97.4°F | Resp 16 | Wt 216.0 lb

## 2013-01-13 DIAGNOSIS — B20 Human immunodeficiency virus [HIV] disease: Secondary | ICD-10-CM

## 2013-01-13 DIAGNOSIS — Z21 Asymptomatic human immunodeficiency virus [HIV] infection status: Secondary | ICD-10-CM

## 2013-01-13 LAB — COMPREHENSIVE METABOLIC PANEL
AST: 26 U/L (ref 0–37)
Albumin: 4.2 g/dL (ref 3.5–5.2)
BUN: 8 mg/dL (ref 6–23)
Calcium: 9.2 mg/dL (ref 8.4–10.5)
Chloride: 103 mEq/L (ref 96–112)
Glucose, Bld: 114 mg/dL — ABNORMAL HIGH (ref 70–99)
Potassium: 3.7 mEq/L (ref 3.5–5.3)

## 2013-01-13 NOTE — Progress Notes (Signed)
Patient here for week 24 a5321 study. He denies any new problems or concerns. He is to return in October for the next study visit.

## 2013-01-27 ENCOUNTER — Encounter: Payer: Self-pay | Admitting: Infectious Diseases

## 2013-01-27 LAB — HIV-1 RNA QUANT-NO REFLEX-BLD: HIV-1 RNA Viral Load: <40

## 2013-01-27 LAB — CD4/CD8 (T-HELPER/T-SUPPRESSOR CELL): CD4: 668

## 2013-02-06 ENCOUNTER — Encounter: Payer: Self-pay | Admitting: Infectious Diseases

## 2013-03-10 ENCOUNTER — Telehealth: Payer: Self-pay | Admitting: *Deleted

## 2013-03-10 NOTE — Telephone Encounter (Signed)
Jash had texted me sometime during the night (saturday) and said he need to see me urgently and had also called, but did not leave a message. I called him back the next morning when I saw his text. He was rather quiet and withdrawn and admitted finally to being depressed and having problems (Losing weight) He did not admit to trying to hurt himself. I instructed him to go to the ED or [facility] right away if he was thinking about hurting himself. Or if he was able to on Monday, come by the [facility] to see either the MD or Bernette Redbird, the counselor. He said he didn't think he could come Monday. I asked him if he had anyone living with him and he said that was part of the problem. I told him I expected to hear from him this week. And reaffirmed with him that he would either go to the ED or behavioral health or at least call the crisis hotline if he felt suicidal.

## 2013-04-15 ENCOUNTER — Ambulatory Visit (INDEPENDENT_AMBULATORY_CARE_PROVIDER_SITE_OTHER): Payer: Self-pay | Admitting: *Deleted

## 2013-04-15 ENCOUNTER — Ambulatory Visit (INDEPENDENT_AMBULATORY_CARE_PROVIDER_SITE_OTHER): Payer: BC Managed Care – PPO | Admitting: *Deleted

## 2013-04-15 DIAGNOSIS — Z0000 Encounter for general adult medical examination without abnormal findings: Secondary | ICD-10-CM

## 2013-04-15 DIAGNOSIS — B20 Human immunodeficiency virus [HIV] disease: Secondary | ICD-10-CM

## 2013-04-15 DIAGNOSIS — Z23 Encounter for immunization: Secondary | ICD-10-CM

## 2013-04-15 DIAGNOSIS — Z21 Asymptomatic human immunodeficiency virus [HIV] infection status: Secondary | ICD-10-CM

## 2013-04-15 NOTE — Progress Notes (Signed)
Patient here for A5322 study appt. He went to his primary care doctor in September for problems with depression and mood disorder and then was referred to psychiatry at [facility] where he is seeing a psychiatrist for med management. He is not getting counseling at this time, so I referred him to Bernette Redbird and we made him an appt for November 20th. He is currently staying with his mother in [place] and has a medical leave of absence from work. He says he has been depressed, disconnected, not knowing what he is doing, as if something else is leading him around and making him do things he doesn't necessarily want to. He finds himself driving in the wrong direction, having paranoid thoughts about different things and has lost weight (20 pounds since may). He thinks it started when he took a roommate in this summer and he felt threatened by his presence. He had never had a roommate before and was uncomfortable with him there. He felt like he was trying to poison him and force him to leave.  He returns in January for the next study appt and will see Dr. Ninetta Lights in November, the same day he is scheduled for [facility].

## 2013-04-26 ENCOUNTER — Other Ambulatory Visit: Payer: Self-pay | Admitting: Infectious Diseases

## 2013-04-28 ENCOUNTER — Other Ambulatory Visit: Payer: Self-pay | Admitting: Licensed Clinical Social Worker

## 2013-04-28 DIAGNOSIS — B20 Human immunodeficiency virus [HIV] disease: Secondary | ICD-10-CM

## 2013-04-28 MED ORDER — ABACAVIR SULFATE-LAMIVUDINE 600-300 MG PO TABS: ORAL_TABLET | ORAL | Status: DC

## 2013-05-21 ENCOUNTER — Ambulatory Visit: Payer: BC Managed Care – PPO | Admitting: Infectious Diseases

## 2013-05-22 ENCOUNTER — Ambulatory Visit: Payer: BC Managed Care – PPO

## 2013-05-22 ENCOUNTER — Ambulatory Visit: Payer: BC Managed Care – PPO | Admitting: Infectious Diseases

## 2013-05-23 ENCOUNTER — Encounter: Payer: Self-pay | Admitting: Infectious Diseases

## 2013-05-26 ENCOUNTER — Ambulatory Visit (INDEPENDENT_AMBULATORY_CARE_PROVIDER_SITE_OTHER): Payer: BC Managed Care – PPO | Admitting: Infectious Diseases

## 2013-05-26 ENCOUNTER — Encounter: Payer: Self-pay | Admitting: Infectious Diseases

## 2013-05-26 VITALS — BP 126/85 | HR 82 | Temp 98.0°F | Ht 67.0 in | Wt 214.0 lb

## 2013-05-26 DIAGNOSIS — Z113 Encounter for screening for infections with a predominantly sexual mode of transmission: Secondary | ICD-10-CM

## 2013-05-26 DIAGNOSIS — Z79899 Other long term (current) drug therapy: Secondary | ICD-10-CM

## 2013-05-26 DIAGNOSIS — F329 Major depressive disorder, single episode, unspecified: Secondary | ICD-10-CM

## 2013-05-26 DIAGNOSIS — B20 Human immunodeficiency virus [HIV] disease: Secondary | ICD-10-CM

## 2013-05-26 NOTE — Progress Notes (Signed)
  Subjective:    Patient ID: Anthony Rich, male    DOB: 1971-02-10, 42 y.o.   MRN: [id number]  HPI 42 yo M with HIV+, has been followed at [facility], on EPZ/EFV. Had CD4 668, HIV RNA <40 (7-14). Today with concerns about depression. Started with  Stress, failed roommate. Anhedonia. Sleeping well, perhaps longer. Eating well, wt up slightly.  Has been on respiridone, has been on since Sept 2014, started by Psychiatrist, [facility].   HIV 1 RNA Quant (copies/mL)  Date Value  02/08/2012 <40   05/15/2011 NOT DETECTED   06/02/2010 39      HIV-1 RNA Viral Load (no units)  Date Value  01/13/2013 <40   07/09/2012 <40   06/03/2012 <40      CD4 T Cell Abs (no units)  Date Value  02/02/2003 286      Review of Systems  Constitutional: Negative for appetite change and unexpected weight change.  Gastrointestinal: Negative for diarrhea and constipation.  Genitourinary: Negative for difficulty urinating.  Psychiatric/Behavioral: Positive for dysphoric mood. Negative for suicidal ideas, sleep disturbance and decreased concentration. The patient is nervous/anxious.        Objective:   Physical Exam  Constitutional: He appears well-developed and well-nourished.  HENT:  Mouth/Throat: No oropharyngeal exudate.  Eyes: EOM are normal. Pupils are equal, round, and reactive to light.  Neck: Neck supple.  Cardiovascular: Normal rate, regular rhythm and normal heart sounds.   Pulmonary/Chest: Effort normal and breath sounds normal.  Abdominal: Soft. Bowel sounds are normal. There is no tenderness. There is no rebound.  Lymphadenopathy:    He has no cervical adenopathy.  Psychiatric: He has a normal mood and affect. Judgment and thought content normal.          Assessment & Plan:

## 2013-05-26 NOTE — Assessment & Plan Note (Addendum)
Follows with [facility]. Suggested he could change his EFV to see if that helps his mental health. Offered/refused condoms. pnvx and flu are up to date. Will see him back in 6 months.

## 2013-05-26 NOTE — Assessment & Plan Note (Signed)
He has appt with Max and Bernette Redbird in next month. He has questions about disability for his mental health. i suggested he f/u with his psychiatrist regarding this.

## 2013-06-05 ENCOUNTER — Ambulatory Visit: Payer: BC Managed Care – PPO | Admitting: *Deleted

## 2013-06-05 DIAGNOSIS — Z590 Homelessness: Secondary | ICD-10-CM

## 2013-06-05 DIAGNOSIS — F329 Major depressive disorder, single episode, unspecified: Secondary | ICD-10-CM

## 2013-06-05 NOTE — Progress Notes (Signed)
Patient ID: Anthony Rich, male   DOB: September 22, 1970, 42 y.o.   MRN: [id number] Met with patient who arrived about 90 minutes late. Anthony Rich denied depression as a primary problem although that was listed as reason for referral. He stated his main concern was that he had become homeless and he wanted help finding a place to stay. He said he had recently stayed with family out of town but that he was not willing to go back there again. He declined to elaborate as to why. I explained that [facility] provided housing placement assistance and he wanted to pursue this. I informed him of Amber in Psychologist, clinical and provided [facility] the patient name and number with a request for her to contact [facility] for further discussion. Writer also called [facility] and they stated they had room for patient and that he could be admitted today if he presents before 3:00 pm. Garison stated he was not familiar with the shelter but could get there and would pursue admission. He was informed that they provide 3 meals per day and allow for up to a 2 month stay. Pt left with stated intention to go to shelter.  Anthony Rich, [facility]

## 2013-06-08 ENCOUNTER — Other Ambulatory Visit: Payer: Self-pay | Admitting: Infectious Diseases

## 2013-07-15 ENCOUNTER — Ambulatory Visit: Payer: BC Managed Care – PPO

## 2013-07-28 ENCOUNTER — Encounter: Payer: Self-pay | Admitting: *Deleted

## 2013-07-28 ENCOUNTER — Ambulatory Visit (INDEPENDENT_AMBULATORY_CARE_PROVIDER_SITE_OTHER): Payer: Self-pay | Admitting: *Deleted

## 2013-07-28 VITALS — BP 137/85 | HR 84 | Temp 98.0°F | Resp 18 | Wt 230.2 lb

## 2013-07-28 DIAGNOSIS — B20 Human immunodeficiency virus [HIV] disease: Secondary | ICD-10-CM

## 2013-07-28 DIAGNOSIS — Z21 Asymptomatic human immunodeficiency virus [HIV] infection status: Secondary | ICD-10-CM

## 2013-07-28 LAB — COMPREHENSIVE METABOLIC PANEL
ALBUMIN: 4.4 g/dL (ref 3.5–5.2)
ALK PHOS: 95 U/L (ref 39–117)
ALT: 32 U/L (ref 0–53)
AST: 18 U/L (ref 0–37)
BUN: 11 mg/dL (ref 6–23)
CO2: 34 mEq/L — ABNORMAL HIGH (ref 19–32)
Calcium: 9.6 mg/dL (ref 8.4–10.5)
Chloride: 102 mEq/L (ref 96–112)
Creat: 1.01 mg/dL (ref 0.50–1.35)
GLUCOSE: 102 mg/dL — AB (ref 70–99)
POTASSIUM: 4 meq/L (ref 3.5–5.3)
Sodium: 140 mEq/L (ref 135–145)
TOTAL PROTEIN: 7.4 g/dL (ref 6.0–8.3)
Total Bilirubin: 0.4 mg/dL (ref 0.3–1.2)

## 2013-07-28 LAB — HEPATITIS C ANTIBODY: HCV Ab: NEGATIVE

## 2013-07-28 NOTE — Progress Notes (Signed)
Patient was here for week 48 A5321 study visit. He seems to be in better spirits. He says he is less depressed and the paranoid thought have decreased, but not entirely gone. He is still seeing a psychiatrist in [facility] (Dr. Inda CokeSavane ?) and is still on LOA from his job. He says he has turned to his religious beliefs to help him get through this. He denies any other physical problems. He will return in April for the A5322 study visit.

## 2013-08-04 ENCOUNTER — Encounter: Payer: Self-pay | Admitting: Infectious Diseases

## 2013-08-04 LAB — CD4/CD8 (T-HELPER/T-SUPPRESSOR CELL)
CD4%: 43.3
CD4: 606
CD8 % Suppressor T Cell: 27.2
CD8: 381

## 2013-08-04 LAB — HIV-1 RNA QUANT-NO REFLEX-BLD

## 2013-08-18 ENCOUNTER — Ambulatory Visit: Payer: BC Managed Care – PPO

## 2013-08-19 ENCOUNTER — Encounter: Payer: Self-pay | Admitting: Infectious Disease

## 2013-08-19 ENCOUNTER — Emergency Department (HOSPITAL_COMMUNITY)
Admission: EM | Admit: 2013-08-19 | Discharge: 2013-08-20 | Disposition: A | Discharge status: Home / Self Care | Payer: BC Managed Care – PPO | Attending: Emergency Medicine | Admitting: Emergency Medicine

## 2013-08-19 ENCOUNTER — Emergency Department (HOSPITAL_COMMUNITY): Payer: BC Managed Care – PPO

## 2013-08-19 DIAGNOSIS — F419 Anxiety disorder, unspecified: Secondary | ICD-10-CM

## 2013-08-19 DIAGNOSIS — R6510 Systemic inflammatory response syndrome (SIRS) of non-infectious origin without acute organ dysfunction: Secondary | ICD-10-CM | POA: Insufficient documentation

## 2013-08-19 DIAGNOSIS — F3289 Other specified depressive episodes: Secondary | ICD-10-CM | POA: Insufficient documentation

## 2013-08-19 DIAGNOSIS — F411 Generalized anxiety disorder: Secondary | ICD-10-CM | POA: Insufficient documentation

## 2013-08-19 DIAGNOSIS — F32A Depression, unspecified: Secondary | ICD-10-CM

## 2013-08-19 DIAGNOSIS — Z79899 Other long term (current) drug therapy: Secondary | ICD-10-CM | POA: Insufficient documentation

## 2013-08-19 DIAGNOSIS — Z21 Asymptomatic human immunodeficiency virus [HIV] infection status: Secondary | ICD-10-CM | POA: Insufficient documentation

## 2013-08-19 DIAGNOSIS — J45909 Unspecified asthma, uncomplicated: Secondary | ICD-10-CM | POA: Insufficient documentation

## 2013-08-19 DIAGNOSIS — F329 Major depressive disorder, single episode, unspecified: Secondary | ICD-10-CM

## 2013-08-19 DIAGNOSIS — Z5900 Homelessness unspecified: Secondary | ICD-10-CM | POA: Insufficient documentation

## 2013-08-19 LAB — URINALYSIS, ROUTINE W REFLEX MICROSCOPIC
BILIRUBIN URINE: NEGATIVE
Glucose, UA: NEGATIVE mg/dL
Hgb urine dipstick: NEGATIVE
KETONES UR: 15 mg/dL — AB
LEUKOCYTES UA: NEGATIVE
Nitrite: NEGATIVE
PH: 7 (ref 5.0–8.0)
Protein, ur: NEGATIVE mg/dL
Specific Gravity, Urine: 1.023 (ref 1.005–1.030)
Urobilinogen, UA: 1 mg/dL (ref 0.0–1.0)

## 2013-08-19 LAB — CBC WITH DIFFERENTIAL/PLATELET
BASOS ABS: 0.1 10*3/uL (ref 0.0–0.1)
BASOS PCT: 0 % (ref 0–1)
Eosinophils Absolute: 0.1 10*3/uL (ref 0.0–0.7)
Eosinophils Relative: 0 % (ref 0–5)
HCT: 41.4 % (ref 39.0–52.0)
Hemoglobin: 13.9 g/dL (ref 13.0–17.0)
Lymphocytes Relative: 8 % — ABNORMAL LOW (ref 12–46)
Lymphs Abs: 1 10*3/uL (ref 0.7–4.0)
MCH: 27.8 pg (ref 26.0–34.0)
MCHC: 33.6 g/dL (ref 30.0–36.0)
MCV: 82.8 fL (ref 78.0–100.0)
MONOS PCT: 12 % (ref 3–12)
Monocytes Absolute: 1.5 10*3/uL — ABNORMAL HIGH (ref 0.1–1.0)
NEUTROS ABS: 9.6 10*3/uL — AB (ref 1.7–7.7)
Neutrophils Relative %: 79 % — ABNORMAL HIGH (ref 43–77)
Platelets: 380 10*3/uL (ref 150–400)
RBC: 5 MIL/uL (ref 4.22–5.81)
RDW: 13.4 % (ref 11.5–15.5)
WBC: 12.3 10*3/uL — ABNORMAL HIGH (ref 4.0–10.5)

## 2013-08-19 LAB — CK: Total CK: 925 U/L — ABNORMAL HIGH (ref 7–232)

## 2013-08-19 LAB — ETHANOL

## 2013-08-19 LAB — POCT I-STAT TROPONIN I: Troponin i, poc: 0.01 ng/mL (ref 0.00–0.08)

## 2013-08-19 LAB — GLUCOSE, CAPILLARY: GLUCOSE-CAPILLARY: 95 mg/dL (ref 70–99)

## 2013-08-19 MED ORDER — LORAZEPAM 2 MG/ML IJ SOLN
2.0000 mg | Freq: Once | INTRAMUSCULAR | Status: AC
  Administered 2013-08-19: 2 mg via INTRAVENOUS
  Filled 2013-08-19: qty 1

## 2013-08-19 NOTE — ED Notes (Signed)
Pt isnt responding any questions asked to him.  Keeps looking around, Is only saying " i want to leave".

## 2013-08-19 NOTE — ED Notes (Signed)
Pt brought in by [facility]s.  sts was found wondering in a parking lot.  Started having aggressive behavior on way here.  [id number]

## 2013-08-19 NOTE — ED Notes (Signed)
Pt brought in with bag of otc meds, car keys,cell phone.

## 2013-08-19 NOTE — ED Notes (Signed)
gpd and hospital security at bedside.

## 2013-08-19 NOTE — ED Provider Notes (Signed)
CSN: [id number]     Arrival date & time 08/19/13  2204 History   First MD Initiated Contact with Patient 08/19/13 2253     Chief Complaint  Patient presents with  . Aggressive Behavior     (Consider location/radiation/quality/duration/timing/severity/associated sxs/prior Treatment) HPI Patient is a 43 yo HIV positive man who is recently homeless. He is BIB EMS after a store owner called because the man was in the store acting strangely. I am not able to obtain any useful history from the patient who appears confused and is not answering questions.   Chart review shows that the patient is followed by the mental health team.   Chart review also shows that the patient's last CD4 count was > 700 and his last HIV viral load check was not detectable  Past Medical History  Diagnosis Date  . Asthma    No past surgical history on file. No family history on file. History  Substance Use Topics  . Smoking status: Never Smoker   . Smokeless tobacco: Never Used  . Alcohol Use: Yes     Comment: rarely    Review of Systems Unable to obtain hx from the patient due to AMS    Allergies  Review of patient's allergies indicates no known allergies.  Home Medications   Current Outpatient Rx  Name  Route  Sig  Dispense  Refill  . abacavir-lamiVUDine (EPZICOM) 600-300 MG per tablet      TAKE 1 TABLET BY MOUTH DAILY.   30 tablet   8   . albuterol (PROVENTIL HFA;VENTOLIN HFA) 108 (90 BASE) MCG/ACT inhaler   Inhalation   Inhale 2 puffs into the lungs every 6 (six) hours as needed. wheezing          . budesonide-formoterol (SYMBICORT) 160-4.5 MCG/ACT inhaler   Inhalation   Inhale 2 puffs into the lungs 2 (two) times daily.           . montelukast (SINGULAIR) 10 MG tablet   Oral   Take 10 mg by mouth at bedtime.           . risperiDONE (RISPERDAL) 1 MG tablet   Oral   Take 1 mg by mouth 2 (two) times daily. 1 in am, 1-2 at night before bed         . SUSTIVA 600 MG tablet     TAKE 1 TABLET BY MOUTH AT BEDTIME   30 tablet   0    BP 155/59  Pulse 106  Temp(Src) 98.3 F (36.8 C) (Oral)  Resp 25  Ht 5\' 8"  (1.727 m)  Wt 250 lb (113.399 kg)  BMI 38.02 kg/m2  SpO2 100% Physical Exam Gen: well developed and well nourished appearing, tremulous at rest, anxious affect,  Head: NCAT Eyes: PERL, EOMI Nose: no epistaixis or rhinorrhea Mouth/throat: mucosa is moist and pink Neck: supple, no stridor Lungs: CTA B, no wheezing, rhonchi or rales CV: rapid and regular pulse around 108 bpm, no murmur, extremities appear well perfused.  Abd: soft, notender, nondistended Back: no ttp, no cva ttp Skin: warm and dry Ext: normal to inspection, no dependent edema Neuro:  moving all 4 extremities with apparently equal strength, no rigidity Psyche; normal affect, intermittently follows simple commands, agitated - trying repeatedly to get out of bed despite coaching from nursing staff to remain in bed  ED Course  Procedures (including critical care time) Labs Review  Results for orders placed during the hospital encounter of 08/19/13 (from the past 24 hour(s))  CBC WITH DIFFERENTIAL     Status: Abnormal   Collection Time    08/19/13 10:30 PM      Result Value Ref Range   WBC 12.3 (*) 4.0 - 10.5 K/uL   RBC 5.00  4.22 - 5.81 MIL/uL   Hemoglobin 13.9  13.0 - 17.0 g/dL   HCT 96.041.4  45.439.0 - 09.852.0 %   MCV 82.8  78.0 - 100.0 fL   MCH 27.8  26.0 - 34.0 pg   MCHC 33.6  30.0 - 36.0 g/dL   RDW 11.913.4  14.711.5 - 82.915.5 %   Platelets 380  150 - 400 K/uL   Neutrophils Relative % 79 (*) 43 - 77 %   Neutro Abs 9.6 (*) 1.7 - 7.7 K/uL   Lymphocytes Relative 8 (*) 12 - 46 %   Lymphs Abs 1.0  0.7 - 4.0 K/uL   Monocytes Relative 12  3 - 12 %   Monocytes Absolute 1.5 (*) 0.1 - 1.0 K/uL   Eosinophils Relative 0  0 - 5 %   Eosinophils Absolute 0.1  0.0 - 0.7 K/uL   Basophils Relative 0  0 - 1 %   Basophils Absolute 0.1  0.0 - 0.1 K/uL  COMPREHENSIVE METABOLIC PANEL     Status: Abnormal    Collection Time    08/19/13 10:30 PM      Result Value Ref Range   Sodium 141  137 - 147 mEq/L   Potassium 4.2  3.7 - 5.3 mEq/L   Chloride 99  96 - 112 mEq/L   CO2 26  19 - 32 mEq/L   Glucose, Bld 120 (*) 70 - 99 mg/dL   BUN 12  6 - 23 mg/dL   Creatinine, Ser 5.620.87  0.50 - 1.35 mg/dL   Calcium 9.1  8.4 - 13.010.5 mg/dL   Total Protein 8.1  6.0 - 8.3 g/dL   Albumin 4.1  3.5 - 5.2 g/dL   AST 33  0 - 37 U/L   ALT 57 (*) 0 - 53 U/L   Alkaline Phosphatase 103  39 - 117 U/L   Total Bilirubin <0.2 (*) 0.3 - 1.2 mg/dL   GFR calc non Af Amer >90  >90 mL/min   GFR calc Af Amer >90  >90 mL/min  CK     Status: Abnormal   Collection Time    08/19/13 10:30 PM      Result Value Ref Range   Total CK 925 (*) 7 - 232 U/L  ETHANOL     Status: None   Collection Time    08/19/13 10:30 PM      Result Value Ref Range   Alcohol, Ethyl (B) <11  0 - 11 mg/dL  GLUCOSE, CAPILLARY     Status: None   Collection Time    08/19/13 10:57 PM      Result Value Ref Range   Glucose-Capillary 95  70 - 99 mg/dL  AMMONIA     Status: None   Collection Time    08/19/13 11:02 PM      Result Value Ref Range   Ammonia 35  11 - 60 umol/L  URINALYSIS, ROUTINE W REFLEX MICROSCOPIC     Status: Abnormal   Collection Time    08/19/13 11:19 PM      Result Value Ref Range   Color, Urine YELLOW  YELLOW   APPearance CLEAR  CLEAR   Specific Gravity, Urine 1.023  1.005 - 1.030   pH 7.0  5.0 -  8.0   Glucose, UA NEGATIVE  NEGATIVE mg/dL   Hgb urine dipstick NEGATIVE  NEGATIVE   Bilirubin Urine NEGATIVE  NEGATIVE   Ketones, ur 15 (*) NEGATIVE mg/dL   Protein, ur NEGATIVE  NEGATIVE mg/dL   Urobilinogen, UA 1.0  0.0 - 1.0 mg/dL   Nitrite NEGATIVE  NEGATIVE   Leukocytes, UA NEGATIVE  NEGATIVE  POCT I-STAT TROPONIN I     Status: None   Collection Time    08/19/13 11:35 PM      Result Value Ref Range   Troponin i, poc 0.01  0.00 - 0.08 ng/mL   Comment 3            CT Head Wo Contrast (Final result)  Result time: 08/20/13  00:05:27    Final result by Rad Results In Interface (08/20/13 00:05:27)    Narrative:   CLINICAL DATA: Altered mental status.  EXAM: CT HEAD WITHOUT CONTRAST  TECHNIQUE: Contiguous axial images were obtained from the base of the skull through the vertex without intravenous contrast.  COMPARISON: MRI of the brain May 12, 2013  FINDINGS: The lateral ventricles remain somewhat prominent for age, similar to prior examination without hydrocephalus. No intraparenchymal hemorrhage, mass effect nor midline shift. No acute large vascular territory infarcts.  No abnormal extra-axial fluid collections. Basal cisterns are patent.  No skull fracture. Visualized paranasal sinuses and mastoid air-cells are well-aerated. The included ocular globes and orbital contents are non-suspicious. Poor dentition.  IMPRESSION: No acute intracranial process. Stable appearance of the head from May 12, 2013.   Electronically Signed By: Awilda Metro On: 08/20/2013 00:05     EKG: sinus tach, no acute ischemic changes, normal intervals, normal axis, normal qrs complex  MDM   Patient is now alert and oriented. He says that he does not know why he was brought to the ED. However, he appears to have full recall of the events preceding being brought to the ED. He recalls being at a convenience store. He says he does not recall acting strangely. He denies use of alcohol or recreational drugs. Denies SI and HI. Denies auditory and visual hallucinations.     Brandt Loosen, MD 08/21/13 [phone]

## 2013-08-20 LAB — COMPREHENSIVE METABOLIC PANEL
ALBUMIN: 4.1 g/dL (ref 3.5–5.2)
ALK PHOS: 103 U/L (ref 39–117)
ALT: 57 U/L — ABNORMAL HIGH (ref 0–53)
AST: 33 U/L (ref 0–37)
BUN: 12 mg/dL (ref 6–23)
CHLORIDE: 99 meq/L (ref 96–112)
CO2: 26 mEq/L (ref 19–32)
Calcium: 9.1 mg/dL (ref 8.4–10.5)
Creatinine, Ser: 0.87 mg/dL (ref 0.50–1.35)
GFR calc Af Amer: >90 mL/min (ref >90)
GFR calc non Af Amer: >90 mL/min (ref >90)
Glucose, Bld: 120 mg/dL — ABNORMAL HIGH (ref 70–99)
POTASSIUM: 4.2 meq/L (ref 3.7–5.3)
Sodium: 141 mEq/L (ref 137–147)
Total Protein: 8.1 g/dL (ref 6.0–8.3)

## 2013-08-20 LAB — RAPID URINE DRUG SCREEN, HOSP PERFORMED
Amphetamines: NOT DETECTED
BARBITURATES: NOT DETECTED
BENZODIAZEPINES: NOT DETECTED
Cocaine: NOT DETECTED
Opiates: NOT DETECTED
Tetrahydrocannabinol: NOT DETECTED

## 2013-08-20 LAB — AMMONIA: Ammonia: 35 umol/L (ref 11–60)

## 2013-08-20 NOTE — ED Notes (Signed)
Pt dc to home. Pt sts understanding to dc instructions. Pt ambulatory to exit without difficulty.  Pt denies need for w/c.  

## 2013-09-01 ENCOUNTER — Telehealth: Payer: Self-pay | Admitting: *Deleted

## 2013-09-01 DIAGNOSIS — B20 Human immunodeficiency virus [HIV] disease: Secondary | ICD-10-CM

## 2013-09-01 MED ORDER — EFAVIRENZ 600 MG PO TABS: ORAL_TABLET | ORAL | Status: DC

## 2013-09-01 NOTE — Telephone Encounter (Signed)
Spoke w/ E. Epperson, Charity fundraiserN in [employer] research.  Pt is a study pt but not one that orders medications.  Pt has been moving between [place] and [place].

## 2013-10-20 ENCOUNTER — Other Ambulatory Visit: Payer: Self-pay | Admitting: Infectious Diseases

## 2013-10-20 ENCOUNTER — Ambulatory Visit (INDEPENDENT_AMBULATORY_CARE_PROVIDER_SITE_OTHER): Payer: Self-pay | Admitting: *Deleted

## 2013-10-20 VITALS — BP 142/87 | HR 82 | Temp 98.4°F | Resp 16 | Ht 66.5 in | Wt 250.2 lb

## 2013-10-20 DIAGNOSIS — Z21 Asymptomatic human immunodeficiency virus [HIV] infection status: Secondary | ICD-10-CM

## 2013-10-20 DIAGNOSIS — B20 Human immunodeficiency virus [HIV] disease: Secondary | ICD-10-CM

## 2013-10-20 LAB — CBC WITH DIFFERENTIAL/PLATELET
BASOS ABS: 0.1 10*3/uL (ref 0.0–0.1)
Basophils Relative: 1 % (ref 0–1)
EOS PCT: 3 % (ref 0–5)
Eosinophils Absolute: 0.2 10*3/uL (ref 0.0–0.7)
HEMATOCRIT: 36.9 % — AB (ref 39.0–52.0)
Hemoglobin: 12.7 g/dL — ABNORMAL LOW (ref 13.0–17.0)
LYMPHS PCT: 28 % (ref 12–46)
Lymphs Abs: 1.5 10*3/uL (ref 0.7–4.0)
MCH: 26.6 pg (ref 26.0–34.0)
MCHC: 34.4 g/dL (ref 30.0–36.0)
MCV: 77.4 fL — AB (ref 78.0–100.0)
MONO ABS: 0.6 10*3/uL (ref 0.1–1.0)
MONOS PCT: 11 % (ref 3–12)
Neutro Abs: 3 10*3/uL (ref 1.7–7.7)
Neutrophils Relative %: 57 % (ref 43–77)
Platelets: 313 10*3/uL (ref 150–400)
RBC: 4.77 MIL/uL (ref 4.22–5.81)
RDW: 15.5 % (ref 11.5–15.5)
WBC: 5.3 10*3/uL (ref 4.0–10.5)

## 2013-10-20 LAB — HEMOGLOBIN A1C
Hgb A1c MFr Bld: 5.9 % — ABNORMAL HIGH (ref <5.7)
Mean Plasma Glucose: 123 mg/dL — ABNORMAL HIGH (ref <117)

## 2013-10-20 LAB — LIPID PANEL
CHOL/HDL RATIO: 3.9 ratio
CHOLESTEROL: 184 mg/dL (ref 0–200)
HDL: 47 mg/dL (ref >39)
LDL Cholesterol: 121 mg/dL — ABNORMAL HIGH (ref 0–99)
TRIGLYCERIDES: 79 mg/dL (ref <150)
VLDL: 16 mg/dL (ref 0–40)

## 2013-10-20 LAB — COMPREHENSIVE METABOLIC PANEL
ALT: 59 U/L — ABNORMAL HIGH (ref 0–53)
AST: 43 U/L — AB (ref 0–37)
Albumin: 4.5 g/dL (ref 3.5–5.2)
Alkaline Phosphatase: 86 U/L (ref 39–117)
BUN: 10 mg/dL (ref 6–23)
CALCIUM: 9.3 mg/dL (ref 8.4–10.5)
CHLORIDE: 103 meq/L (ref 96–112)
CO2: 25 meq/L (ref 19–32)
CREATININE: 0.85 mg/dL (ref 0.50–1.35)
GLUCOSE: 88 mg/dL (ref 70–99)
Potassium: 4 mEq/L (ref 3.5–5.3)
Sodium: 139 mEq/L (ref 135–145)
Total Bilirubin: 0.3 mg/dL (ref 0.2–1.2)
Total Protein: 7.2 g/dL (ref 6.0–8.3)

## 2013-10-20 NOTE — Progress Notes (Signed)
Patient here for week 48 study visit. He says he feels much better now, not nearly as depressed or paranoid. He is not taking any risperdal but continues to take his other meds regularly. He has an appt for Dr. Ninetta LightsHatcher coming up in June and he has an appt. for the other study the first of June.

## 2013-10-21 LAB — CREATININE, URINE, RANDOM: Creatinine, Urine: 141.9 mg/dL

## 2013-10-21 LAB — PROTEIN, URINE, RANDOM: Total Protein, Urine: 8 mg/dL

## 2013-11-17 ENCOUNTER — Ambulatory Visit (INDEPENDENT_AMBULATORY_CARE_PROVIDER_SITE_OTHER): Payer: Self-pay | Admitting: *Deleted

## 2013-11-17 VITALS — BP 142/92 | HR 92 | Temp 98.1°F | Resp 16 | Wt 251.5 lb

## 2013-11-17 DIAGNOSIS — B20 Human immunodeficiency virus [HIV] disease: Secondary | ICD-10-CM

## 2013-11-17 MED ORDER — EFAVIRENZ 600 MG PO TABS: ORAL_TABLET | ORAL | Status: DC

## 2013-11-17 NOTE — Progress Notes (Signed)
Patient here for his week 72 A5321 study visit. He seems to be in a much better frame of mind, not depressed. He says he is not having paranoid thoughts or trouble concentrating any more. He recently found a publisher who has promised to publish a book of poems he has written and he is excited about that. The book is called "My Voice".  He denies any current problems or concerns. We are not getting CD4s at this visit and I told him we could get one off study but he didn't want to right now. He sees Dr. Ninetta LightsHatcher next month and I told him that he may want him to get one when he sees him. He will return in September for the next study visit.

## 2013-12-01 ENCOUNTER — Ambulatory Visit (INDEPENDENT_AMBULATORY_CARE_PROVIDER_SITE_OTHER): Payer: BC Managed Care – PPO | Admitting: Infectious Diseases

## 2013-12-01 ENCOUNTER — Encounter: Payer: Self-pay | Admitting: Infectious Diseases

## 2013-12-01 ENCOUNTER — Other Ambulatory Visit: Payer: Self-pay | Admitting: Infectious Diseases

## 2013-12-01 VITALS — BP 137/90 | HR 93 | Temp 97.4°F | Wt 254.0 lb

## 2013-12-01 DIAGNOSIS — F329 Major depressive disorder, single episode, unspecified: Secondary | ICD-10-CM

## 2013-12-01 DIAGNOSIS — Z113 Encounter for screening for infections with a predominantly sexual mode of transmission: Secondary | ICD-10-CM

## 2013-12-01 DIAGNOSIS — F3289 Other specified depressive episodes: Secondary | ICD-10-CM

## 2013-12-01 DIAGNOSIS — Z79899 Other long term (current) drug therapy: Secondary | ICD-10-CM

## 2013-12-01 DIAGNOSIS — F32A Depression, unspecified: Secondary | ICD-10-CM

## 2013-12-01 DIAGNOSIS — B20 Human immunodeficiency virus [HIV] disease: Secondary | ICD-10-CM

## 2013-12-01 LAB — COMPREHENSIVE METABOLIC PANEL
ALT: 54 U/L — ABNORMAL HIGH (ref 0–53)
AST: 26 U/L (ref 0–37)
Albumin: 4.3 g/dL (ref 3.5–5.2)
Alkaline Phosphatase: 107 U/L (ref 39–117)
BUN: 12 mg/dL (ref 6–23)
CALCIUM: 9.3 mg/dL (ref 8.4–10.5)
CO2: 29 meq/L (ref 19–32)
CREATININE: 0.89 mg/dL (ref 0.50–1.35)
Chloride: 106 mEq/L (ref 96–112)
Glucose, Bld: 104 mg/dL — ABNORMAL HIGH (ref 70–99)
Potassium: 4.1 mEq/L (ref 3.5–5.3)
Sodium: 142 mEq/L (ref 135–145)
Total Bilirubin: 0.2 mg/dL (ref 0.2–1.2)
Total Protein: 7.2 g/dL (ref 6.0–8.3)

## 2013-12-01 LAB — CBC
HCT: 39.7 % (ref 39.0–52.0)
Hemoglobin: 13.3 g/dL (ref 13.0–17.0)
MCH: 26.2 pg (ref 26.0–34.0)
MCHC: 33.5 g/dL (ref 30.0–36.0)
MCV: 78.1 fL (ref 78.0–100.0)
PLATELETS: 303 10*3/uL (ref 150–400)
RBC: 5.08 MIL/uL (ref 4.22–5.81)
RDW: 15.4 % (ref 11.5–15.5)
WBC: 4.3 10*3/uL (ref 4.0–10.5)

## 2013-12-01 LAB — LIPID PANEL
CHOL/HDL RATIO: 4.4 ratio
CHOLESTEROL: 168 mg/dL (ref 0–200)
HDL: 38 mg/dL — AB (ref >39)
LDL Cholesterol: 100 mg/dL — ABNORMAL HIGH (ref 0–99)
Triglycerides: 148 mg/dL (ref <150)
VLDL: 30 mg/dL (ref 0–40)

## 2013-12-01 NOTE — Progress Notes (Signed)
   Subjective:    Patient ID: Jasai Sandelin, male    DOB: [birth year], 43 y.o.   MRN: [id number]  HPI 43 yo M with HIV+, has been followed at [facility], on EPZ/EFV.  Depression has been better. Attributes this to praying, faith.  Has been walking, watching what he eats.  HIV RNA < 40 (11-17-13). No CD4.   Review of Systems  Constitutional: Positive for unexpected weight change. Negative for appetite change.  Gastrointestinal: Negative for diarrhea and constipation.  Genitourinary: Negative for difficulty urinating.       Objective:   Physical Exam  Constitutional: He appears well-developed and well-nourished.  HENT:  Mouth/Throat: No oropharyngeal exudate.  Eyes: EOM are normal. Pupils are equal, round, and reactive to light.  Neck: Neck supple.  Cardiovascular: Normal rate, regular rhythm and normal heart sounds.   Pulmonary/Chest: Effort normal and breath sounds normal.  Abdominal: Soft. Bowel sounds are normal. He exhibits no distension. There is no tenderness.  Lymphadenopathy:    He has no cervical adenopathy.          Assessment & Plan:

## 2013-12-01 NOTE — Assessment & Plan Note (Signed)
Hep B immune in 2008. He is doing well on [facility]. Will check his CD4, RPR, Lipids, ect today. He is given condoms. Greatly appreciate [facility].

## 2013-12-01 NOTE — Assessment & Plan Note (Signed)
Much better, will continue to watch.

## 2013-12-02 LAB — T-HELPER CELL (CD4) - (RCID CLINIC ONLY)
CD4 % Helper T Cell: 38 % (ref 33–55)
CD4 T CELL ABS: 450 /uL (ref 400–2700)

## 2013-12-02 LAB — RPR

## 2013-12-11 ENCOUNTER — Encounter: Payer: Self-pay | Admitting: Infectious Diseases

## 2013-12-11 LAB — HIV-1 RNA QUANT-NO REFLEX-BLD

## 2013-12-22 NOTE — Telephone Encounter (Signed)
error 

## 2014-02-24 ENCOUNTER — Other Ambulatory Visit: Payer: Self-pay | Admitting: Infectious Diseases

## 2014-02-24 DIAGNOSIS — B20 Human immunodeficiency virus [HIV] disease: Secondary | ICD-10-CM

## 2014-05-02 IMAGING — CT CT HEAD W/O CM
2 series · 16 of 30 positions shown, 18 images · non-contrast
Comparison: MRI of the brain May 12, 2013

CLINICAL DATA: Altered mental status.

EXAM:
CT HEAD WITHOUT CONTRAST
TECHNIQUE: Contiguous axial images were obtained from the base of the skull
through the vertex without intravenous contrast.

[Series 3: head w/o · axial · non-contrast · 0.52mm/px · z∈[+119,+239]mm · 8 of 32 slices shown, 10 images]
[im 4/32  brain]
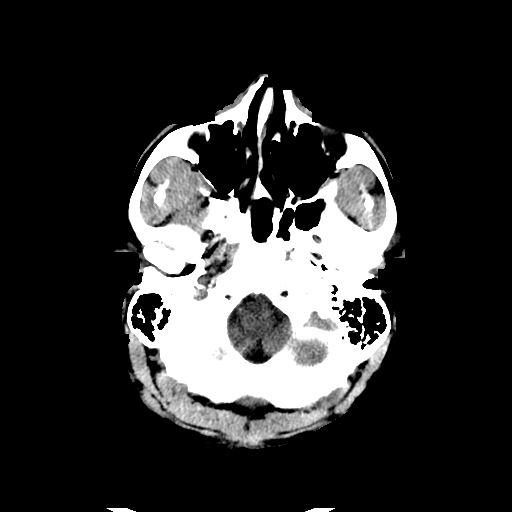
[im 4/32  bone]
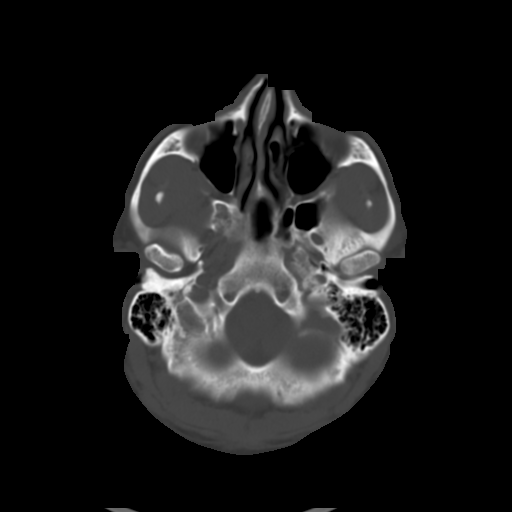
[im 7/32  brain]
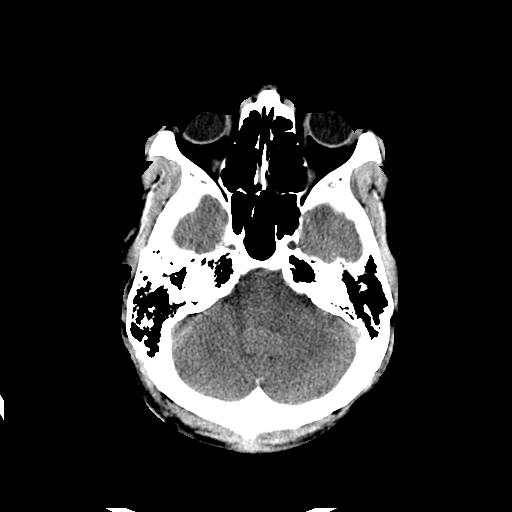
[im 11/32  brain]
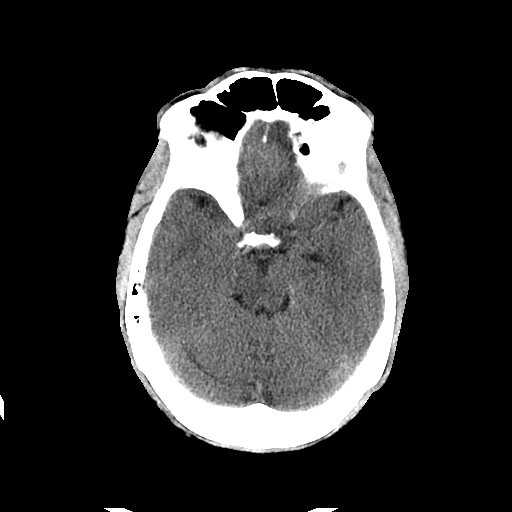
[im 14/32  brain]
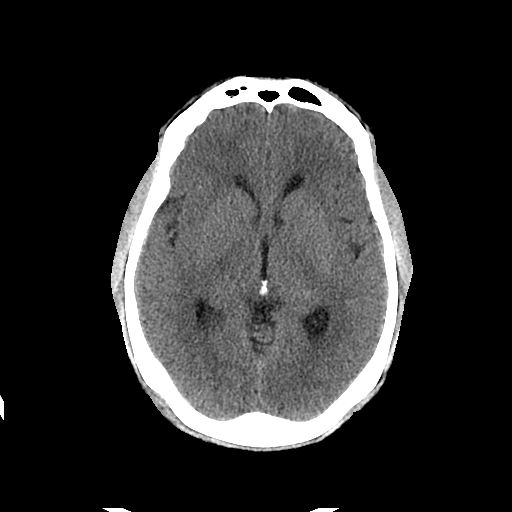
[im 18/32  brain]
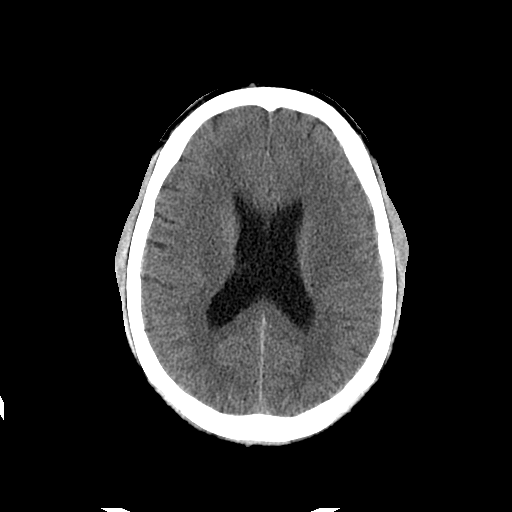
[im 18/32  bone]
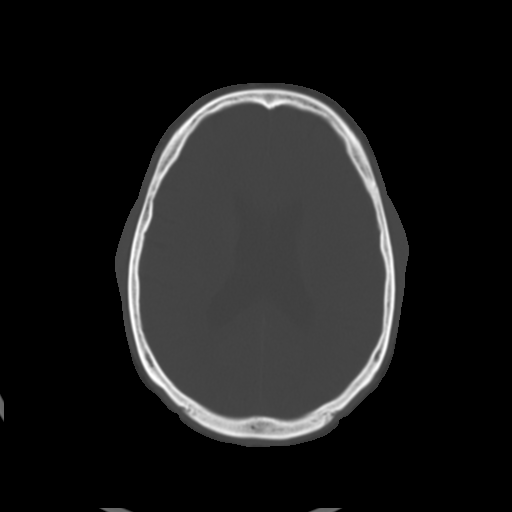
[im 21/32  brain]
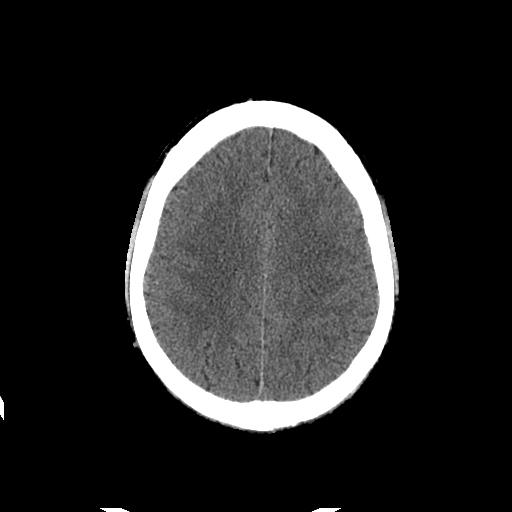
[im 25/32  brain]
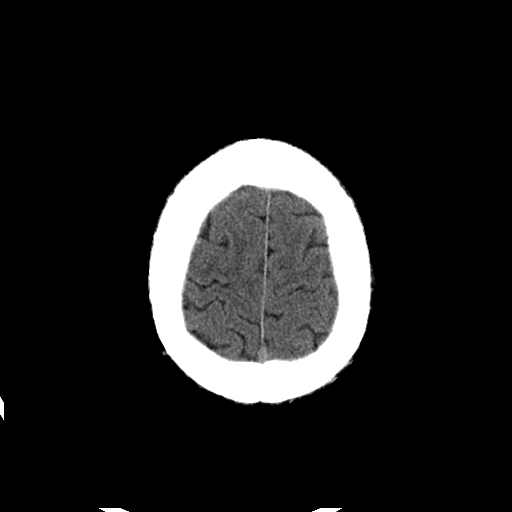
[im 28/32  brain]
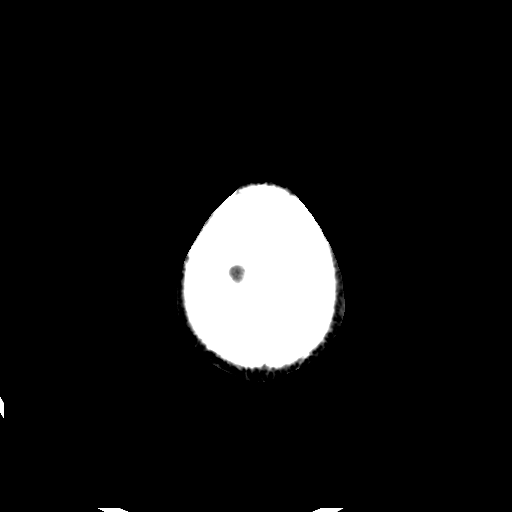

[Series 4: head w/o bone · axial · non-contrast · 0.52mm/px · z∈[+119,+242]mm · 8 of 63 slices shown]
[im 7/63  bone]
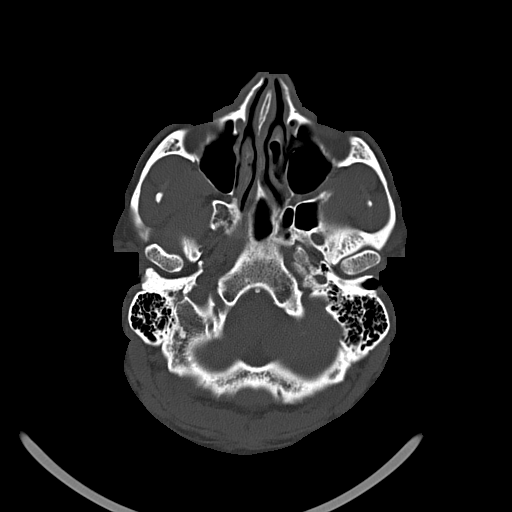
[im 14/63  bone]
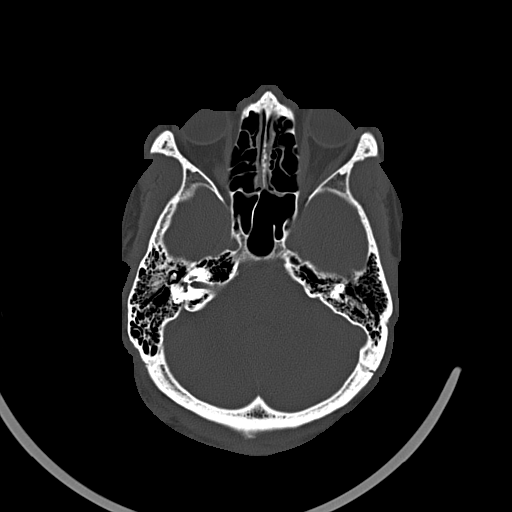
[im 20/63  bone]
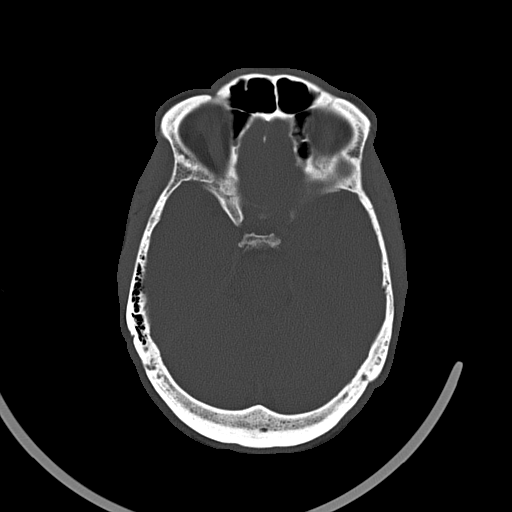
[im 27/63  bone]
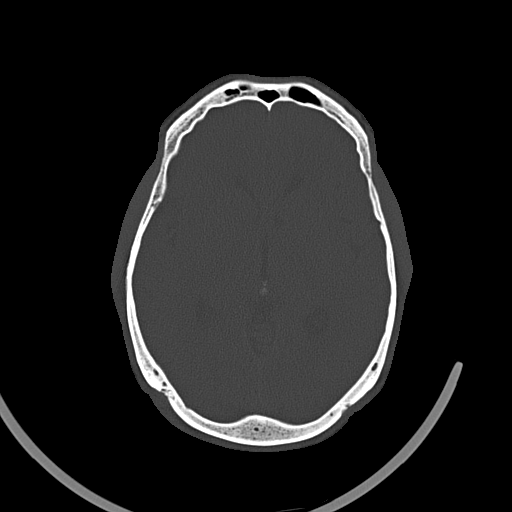
[im 36/63  bone]
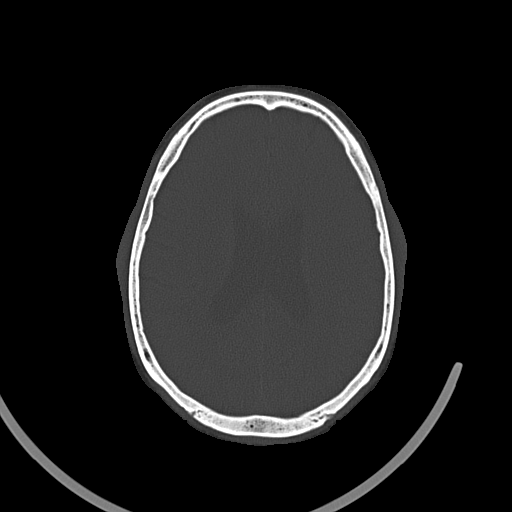
[im 43/63  bone]
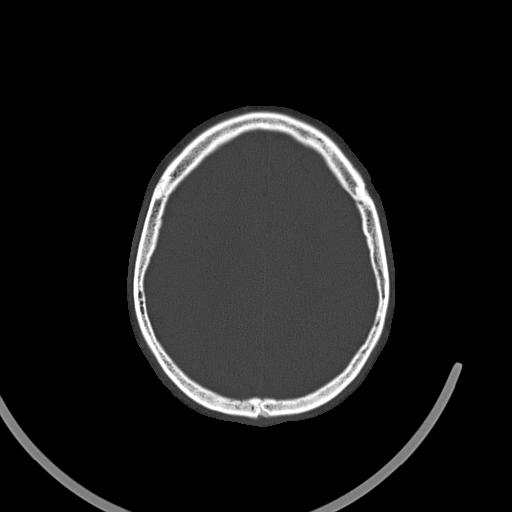
[im 49/63  bone]
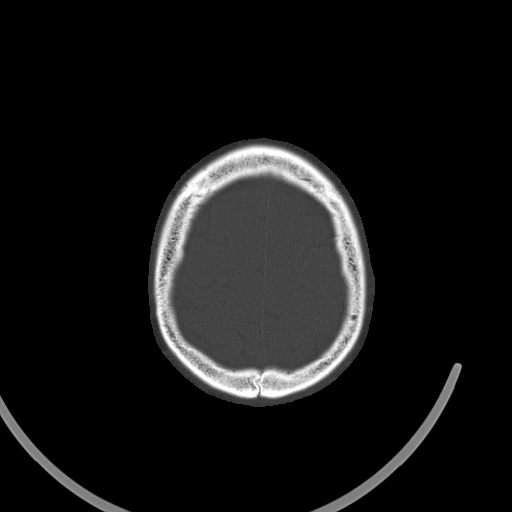
[im 56/63  bone]
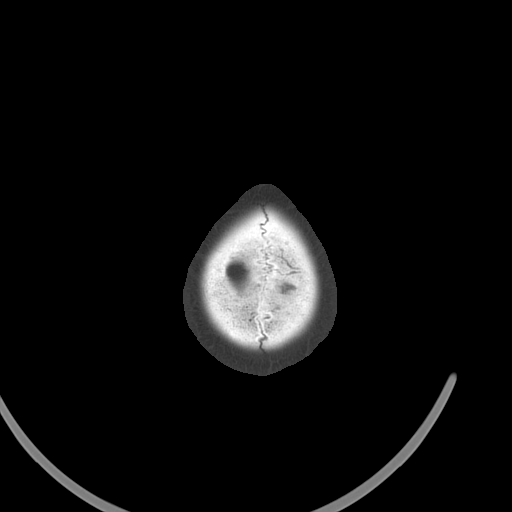

[16 of 30 positions shown; findings below may reference images not displayed]

FINDINGS: The lateral ventricles remain somewhat prominent for age, similar to
prior examination without hydrocephalus. No intraparenchymal
hemorrhage, mass effect nor midline shift. No acute large vascular
territory infarcts.

No abnormal extra-axial fluid collections. Basal cisterns are
patent.

No skull fracture. Visualized paranasal sinuses and mastoid
air-cells are well-aerated. The included ocular globes and orbital
contents are non-suspicious. Poor dentition.
IMPRESSION: No acute intracranial process. Stable appearance of the head from
May 12, 2013.

  By: Acevedo Lafond

## 2014-05-04 ENCOUNTER — Other Ambulatory Visit: Payer: Self-pay | Admitting: Infectious Diseases

## 2014-06-03 ENCOUNTER — Ambulatory Visit: Payer: BC Managed Care – PPO | Admitting: Infectious Diseases

## 2014-06-22 ENCOUNTER — Ambulatory Visit: Payer: BC Managed Care – PPO | Admitting: Infectious Diseases

## 2014-07-06 ENCOUNTER — Ambulatory Visit: Payer: BC Managed Care – PPO | Admitting: Infectious Diseases

## 2014-09-08 ENCOUNTER — Other Ambulatory Visit: Payer: Self-pay | Admitting: Infectious Diseases

## 2014-09-08 DIAGNOSIS — B20 Human immunodeficiency virus [HIV] disease: Secondary | ICD-10-CM

## 2014-09-09 ENCOUNTER — Telehealth: Payer: Self-pay | Admitting: *Deleted

## 2014-09-09 NOTE — Telephone Encounter (Signed)
Received request to refill sustiva. Sent with 1 refill.  Left message notifying patient that he is in need of an appointment for future refills. Pharmacy [place] located in [place], [place].  [place], [place].  Need to verify his contact info and see if he has changed providers/moved. Andree CossHowell, Felicita Nuncio M, RN

## 2014-09-18 ENCOUNTER — Ambulatory Visit (INDEPENDENT_AMBULATORY_CARE_PROVIDER_SITE_OTHER): Payer: Self-pay | Admitting: *Deleted

## 2014-09-18 VITALS — BP 134/91 | HR 86 | Temp 98.0°F | Resp 16 | Ht 67.5 in | Wt 253.0 lb

## 2014-09-18 DIAGNOSIS — Z006 Encounter for examination for normal comparison and control in clinical research program: Secondary | ICD-10-CM

## 2014-09-18 LAB — COMPREHENSIVE METABOLIC PANEL
ALT: 45 U/L (ref 0–53)
AST: 23 U/L (ref 0–37)
Albumin: 4.2 g/dL (ref 3.5–5.2)
Alkaline Phosphatase: 118 U/L — ABNORMAL HIGH (ref 39–117)
BUN: 9 mg/dL (ref 6–23)
CHLORIDE: 100 meq/L (ref 96–112)
CO2: 27 mEq/L (ref 19–32)
Calcium: 9.1 mg/dL (ref 8.4–10.5)
Creat: 0.79 mg/dL (ref 0.50–1.35)
GLUCOSE: 108 mg/dL — AB (ref 70–99)
Potassium: 4 mEq/L (ref 3.5–5.3)
Sodium: 137 mEq/L (ref 135–145)
Total Bilirubin: 0.5 mg/dL (ref 0.2–1.2)
Total Protein: 7.6 g/dL (ref 6.0–8.3)

## 2014-09-18 LAB — LIPID PANEL
CHOL/HDL RATIO: 5.6 ratio
CHOLESTEROL: 189 mg/dL (ref 0–200)
HDL: 34 mg/dL — ABNORMAL LOW (ref >=40)
LDL Cholesterol: 121 mg/dL — ABNORMAL HIGH (ref 0–99)
Triglycerides: 171 mg/dL — ABNORMAL HIGH (ref <150)
VLDL: 34 mg/dL (ref 0–40)

## 2014-09-18 LAB — HEPATITIS C ANTIBODY: HCV Ab: NEGATIVE

## 2014-09-18 LAB — HEPATITIS B SURFACE ANTIBODY,QUALITATIVE: Hep B S Ab: POSITIVE — AB

## 2014-09-18 LAB — HEPATITIS B SURFACE ANTIGEN: Hepatitis B Surface Ag: NEGATIVE

## 2014-09-18 NOTE — Progress Notes (Signed)
Anthony FavreClarence is here for his week 96 A5322 study visit. He missed his last study visit for A5321 when we lost contact with him, but he says he is now more stable and ready to get back on track. He is living with his mother in [place] and has a job there working for [employer] and has Program[employer] insurance. He denies any health problems but is obese and working on losing weight. I made an appt with Dr. Ninetta LightsHatcher for him and he will also see us for the week 120 A5321 study visit

## 2014-09-19 LAB — HEMOGLOBIN A1C
Hgb A1c MFr Bld: 6 % — ABNORMAL HIGH (ref <5.7)
MEAN PLASMA GLUCOSE: 126 mg/dL — AB (ref <117)

## 2014-09-19 LAB — PROTEIN, URINE, RANDOM: TOTAL PROTEIN, URINE: 19 mg/dL (ref 5–25)

## 2014-09-19 LAB — CREATININE, URINE, RANDOM: CREATININE, URINE: 243.1 mg/dL

## 2014-10-07 ENCOUNTER — Encounter: Payer: Self-pay | Admitting: Infectious Diseases

## 2014-10-07 LAB — CD4/CD8 (T-HELPER/T-SUPPRESSOR CELL)
CD4%: 42.3
CD4: 508
CD8 % Suppressor T Cell: 23.9
CD8: 287

## 2014-10-07 LAB — HIV-1 RNA QUANT-NO REFLEX-BLD: HIV-1 RNA Viral Load: <40

## 2014-10-19 ENCOUNTER — Encounter: Payer: Self-pay | Admitting: Infectious Diseases

## 2014-10-19 ENCOUNTER — Ambulatory Visit (INDEPENDENT_AMBULATORY_CARE_PROVIDER_SITE_OTHER): Payer: Self-pay | Admitting: *Deleted

## 2014-10-19 ENCOUNTER — Ambulatory Visit (INDEPENDENT_AMBULATORY_CARE_PROVIDER_SITE_OTHER): Payer: BLUE CROSS/BLUE SHIELD | Admitting: Infectious Diseases

## 2014-10-19 VITALS — BP 134/85 | HR 88 | Temp 98.4°F | Resp 16 | Wt 257.5 lb

## 2014-10-19 VITALS — BP 134/85 | HR 88 | Temp 98.4°F | Resp 16 | Ht 66.93 in | Wt 257.5 lb

## 2014-10-19 DIAGNOSIS — Z113 Encounter for screening for infections with a predominantly sexual mode of transmission: Secondary | ICD-10-CM | POA: Diagnosis not present

## 2014-10-19 DIAGNOSIS — J4520 Mild intermittent asthma, uncomplicated: Secondary | ICD-10-CM

## 2014-10-19 DIAGNOSIS — F32A Depression, unspecified: Secondary | ICD-10-CM

## 2014-10-19 DIAGNOSIS — B20 Human immunodeficiency virus [HIV] disease: Secondary | ICD-10-CM

## 2014-10-19 DIAGNOSIS — Z006 Encounter for examination for normal comparison and control in clinical research program: Secondary | ICD-10-CM

## 2014-10-19 DIAGNOSIS — F329 Major depressive disorder, single episode, unspecified: Secondary | ICD-10-CM

## 2014-10-19 LAB — COMPREHENSIVE METABOLIC PANEL
ALBUMIN: 4.4 g/dL (ref 3.5–5.2)
ALT: 54 U/L — ABNORMAL HIGH (ref 0–53)
AST: 25 U/L (ref 0–37)
Alkaline Phosphatase: 123 U/L — ABNORMAL HIGH (ref 39–117)
BUN: 11 mg/dL (ref 6–23)
CALCIUM: 9.3 mg/dL (ref 8.4–10.5)
CHLORIDE: 100 meq/L (ref 96–112)
CO2: 26 meq/L (ref 19–32)
Creat: 0.79 mg/dL (ref 0.50–1.35)
Glucose, Bld: 110 mg/dL — ABNORMAL HIGH (ref 70–99)
POTASSIUM: 3.9 meq/L (ref 3.5–5.3)
SODIUM: 138 meq/L (ref 135–145)
TOTAL PROTEIN: 7.6 g/dL (ref 6.0–8.3)
Total Bilirubin: 0.6 mg/dL (ref 0.2–1.2)

## 2014-10-19 LAB — CHOLESTEROL, TOTAL: CHOLESTEROL: 212 mg/dL — AB (ref 0–200)

## 2014-10-19 MED ORDER — ABACAVIR SULFATE-LAMIVUDINE 600-300 MG PO TABS: 1.0000 | ORAL_TABLET | Freq: Every day | ORAL | Status: DC

## 2014-10-19 MED ORDER — ALBUTEROL SULFATE HFA 108 (90 BASE) MCG/ACT IN AERS: 2.0000 | INHALATION_SPRAY | Freq: Four times a day (QID) | RESPIRATORY_TRACT | Status: DC | PRN

## 2014-10-19 MED ORDER — EFAVIRENZ 600 MG PO TABS: 600.0000 mg | ORAL_TABLET | Freq: Every day | ORAL | Status: DC

## 2014-10-19 MED ORDER — BUDESONIDE-FORMOTEROL FUMARATE 160-4.5 MCG/ACT IN AERO: 2.0000 | INHALATION_SPRAY | Freq: Two times a day (BID) | RESPIRATORY_TRACT | Status: DC

## 2014-10-19 NOTE — Addendum Note (Signed)
Addended bySteva Colder: Sammy Douthitt on: 10/19/2014 10:49 AM   Modules accepted: Orders

## 2014-10-19 NOTE — Progress Notes (Signed)
   Subjective:    Patient ID: Alfonse AlpersClarence Easter Jr, male    DOB: 12/20/1970, 44 y.o.   MRN: [id number]  HPI 44 yo M with HIV+, has been followed at [facility], on EPZ/EFV.  Depression has been better. Hep B immune.  HIV RNA < 40, CD4 508 (09-18-14)/[facility].   Has book of poetry about to be published.  No problems with ART.  No depression.   Review of Systems  Constitutional: Negative for appetite change and unexpected weight change.  Gastrointestinal: Negative for diarrhea and constipation.  Genitourinary: Negative for difficulty urinating.  Psychiatric/Behavioral: Negative for dysphoric mood.      Objective:   Physical Exam  Constitutional: He appears well-developed and well-nourished.  HENT:  Mouth/Throat: No oropharyngeal exudate.  Eyes: EOM are normal. Pupils are equal, round, and reactive to light.  Neck: Neck supple.  Cardiovascular: Normal rate, regular rhythm and normal heart sounds.   Pulmonary/Chest: Effort normal and breath sounds normal.  Abdominal: Soft. Bowel sounds are normal. He exhibits no distension. There is no tenderness.  Musculoskeletal: He exhibits edema.  Lymphadenopathy:    He has no cervical adenopathy.      Assessment & Plan:

## 2014-10-19 NOTE — Assessment & Plan Note (Signed)
Appears to be quiescent.

## 2014-10-19 NOTE — Progress Notes (Signed)
Marilu FavreClarence is here for A5321 wk 120 and screening for [phone]. We reviewed the informed consent together. I fully explained the consent, risks, benefits, responsibilities, and answered questions. Participant verbalized understanding and signed the consent witnessed by me. I gave a copy of the signed consent to participant. We also reviewed letter to participants of [phone], answered any questions, he initialed the letter and I provided a copy of this. Had a weight increase of 7lbs since last study visit and BMI is right at 40. He has started to watch his intake, eat healthier, and try to get some of his weight off. He has moved to [place] with his mother (away from the negativity in his life) and is in a better mood. Fasting labs were drawn and vital signs stable. He received $150 gift card for travel and study visit. Next appt scheduled for December 07, 2014 @ 9am. Tacey HeapElisha Epperson

## 2014-10-19 NOTE — Assessment & Plan Note (Addendum)
He is doing very well. Needs rpr.  Consider changing him to [facility], defer to [facility].  Offered/refused condoms.  rtc 6 months.

## 2014-10-19 NOTE — Assessment & Plan Note (Signed)
He is doing well, his meds are refilled. He is looking for new PCP.

## 2014-10-20 LAB — RPR

## 2014-11-05 ENCOUNTER — Encounter: Payer: Self-pay | Admitting: Infectious Diseases

## 2014-11-05 LAB — HIV-1 RNA QUANT-NO REFLEX-BLD: HIV-1 RNA Viral Load: <40

## 2014-12-07 ENCOUNTER — Encounter (INDEPENDENT_AMBULATORY_CARE_PROVIDER_SITE_OTHER): Payer: Self-pay | Admitting: *Deleted

## 2014-12-07 VITALS — BP 133/85 | HR 80 | Temp 98.2°F | Resp 16 | Ht 66.93 in | Wt 260.8 lb

## 2014-12-07 DIAGNOSIS — Z006 Encounter for examination for normal comparison and control in clinical research program: Secondary | ICD-10-CM

## 2014-12-10 NOTE — Progress Notes (Signed)
Dokota is here for [phone] entry visit. He did consent to [phone] however BMI is >40 so his is not eligible. Reviewed medications, s&s, took vitals, and drew blood. He has no new complaints. EKG performed. Medications were dispensed and we discussed how to take them, potential side effects, and when to call. He received $50 gift card for study. Next appointment in July. Tacey Heap RN

## 2015-01-06 ENCOUNTER — Encounter (INDEPENDENT_AMBULATORY_CARE_PROVIDER_SITE_OTHER): Payer: Self-pay | Admitting: *Deleted

## 2015-01-06 ENCOUNTER — Encounter: Payer: Self-pay | Admitting: Infectious Diseases

## 2015-01-06 ENCOUNTER — Encounter: Payer: Self-pay | Admitting: *Deleted

## 2015-01-06 VITALS — BP 140/88 | HR 98 | Temp 98.0°F | Resp 16 | Wt 264.0 lb

## 2015-01-06 DIAGNOSIS — Z006 Encounter for examination for normal comparison and control in clinical research program: Secondary | ICD-10-CM

## 2015-01-06 LAB — COMPREHENSIVE METABOLIC PANEL
ALT: 41 U/L (ref 0–53)
AST: 21 U/L (ref 0–37)
Albumin: 4.2 g/dL (ref 3.5–5.2)
Alkaline Phosphatase: 103 U/L (ref 39–117)
BUN: 11 mg/dL (ref 6–23)
CO2: 27 meq/L (ref 19–32)
CREATININE: 0.78 mg/dL (ref 0.50–1.35)
Calcium: 9.2 mg/dL (ref 8.4–10.5)
Chloride: 104 mEq/L (ref 96–112)
GLUCOSE: 107 mg/dL — AB (ref 70–99)
POTASSIUM: 4.3 meq/L (ref 3.5–5.3)
Sodium: 142 mEq/L (ref 135–145)
Total Bilirubin: 0.4 mg/dL (ref 0.2–1.2)
Total Protein: 7.2 g/dL (ref 6.0–8.3)

## 2015-01-06 NOTE — Progress Notes (Signed)
Anthony Rich is here for [phone] and [phone] study. He denies any new complaints or issues. He has been adherent to medications most of the time. He did miss dose on last Thursday. Pill count performed and he has 145 pills left of Pitivastatin which leaves him 2 pills more than what is expected. I discussed adherence and the importance behind it and verbalized understanding. Blood drawn with no problems and vital signs obtained. Study meds were dispensed. He received $150 gift card for travel + 2 study visits. His next appointment is scheduled for 9/26 @ 10am for A5332 and A5321. Tacey HeapElisha Alorah Mcree RN

## 2015-03-29 ENCOUNTER — Encounter: Payer: Self-pay | Admitting: *Deleted

## 2015-03-29 ENCOUNTER — Encounter (INDEPENDENT_AMBULATORY_CARE_PROVIDER_SITE_OTHER): Payer: Self-pay | Admitting: *Deleted

## 2015-03-29 VITALS — BP 134/91 | HR 88 | Temp 98.5°F | Resp 16 | Wt 265.0 lb

## 2015-03-29 DIAGNOSIS — Z23 Encounter for immunization: Secondary | ICD-10-CM

## 2015-03-29 DIAGNOSIS — Z006 Encounter for examination for normal comparison and control in clinical research program: Secondary | ICD-10-CM

## 2015-03-29 DIAGNOSIS — B20 Human immunodeficiency virus [HIV] disease: Secondary | ICD-10-CM

## 2015-03-29 LAB — CBC WITH DIFFERENTIAL/PLATELET
BASOS ABS: 0 10*3/uL (ref 0.0–0.1)
Basophils Relative: 0 % (ref 0–1)
Eosinophils Absolute: 0.2 10*3/uL (ref 0.0–0.7)
Eosinophils Relative: 4 % (ref 0–5)
HEMATOCRIT: 42.5 % (ref 39.0–52.0)
HEMOGLOBIN: 13.8 g/dL (ref 13.0–17.0)
LYMPHS PCT: 29 % (ref 12–46)
Lymphs Abs: 1.4 10*3/uL (ref 0.7–4.0)
MCH: 24.8 pg — AB (ref 26.0–34.0)
MCHC: 32.5 g/dL (ref 30.0–36.0)
MCV: 76.3 fL — AB (ref 78.0–100.0)
MONOS PCT: 12 % (ref 3–12)
MPV: 8.8 fL (ref 8.6–12.4)
Monocytes Absolute: 0.6 10*3/uL (ref 0.1–1.0)
NEUTROS PCT: 55 % (ref 43–77)
Neutro Abs: 2.6 10*3/uL (ref 1.7–7.7)
Platelets: 283 10*3/uL (ref 150–400)
RBC: 5.57 MIL/uL (ref 4.22–5.81)
RDW: 15.8 % — ABNORMAL HIGH (ref 11.5–15.5)
WBC: 4.8 10*3/uL (ref 4.0–10.5)

## 2015-03-29 LAB — COMPREHENSIVE METABOLIC PANEL
ALK PHOS: 92 U/L (ref 40–115)
ALT: 45 U/L (ref 9–46)
AST: 28 U/L (ref 10–40)
Albumin: 4.2 g/dL (ref 3.6–5.1)
BILIRUBIN TOTAL: 0.4 mg/dL (ref 0.2–1.2)
BUN: 8 mg/dL (ref 7–25)
CALCIUM: 9.3 mg/dL (ref 8.6–10.3)
CO2: 31 mmol/L (ref 20–31)
Chloride: 100 mmol/L (ref 98–110)
Creat: 0.8 mg/dL (ref 0.60–1.35)
Glucose, Bld: 97 mg/dL (ref 65–99)
POTASSIUM: 4.1 mmol/L (ref 3.5–5.3)
Sodium: 136 mmol/L (ref 135–146)
Total Protein: 8.1 g/dL (ref 6.1–8.1)

## 2015-03-29 NOTE — Addendum Note (Signed)
Addended by: Mariea Clonts D on: 03/29/2015 04:41 PM   Modules accepted: Orders

## 2015-03-29 NOTE — Progress Notes (Signed)
Anthony Rich is here for [phone] and [phone] visits. We discussed LOA's for both studies and I answered his questions. He verbalized understanding and signed consents. He denies any changes in symptoms and/or medications. He denies any muscle aches/weakness. Pill count performed with #82 remaining. Study med was dispensed. Vitals obtained and lab draw collected. He has no new phone number as of yet but states he will keep [place] informed if this changes. He received $150 for travel plus study visit. Next appointments are January 2 and March 28th. Tacey Heap RN

## 2015-03-30 LAB — HEPATITIS C ANTIBODY: HCV Ab: NEGATIVE

## 2015-03-31 LAB — CD4/CD8 (T-HELPER/T-SUPPRESSOR CELL)
CD4 ABSOLUTE: 450 /uL — AB (ref 500–1900)
CD4%: 30 % (ref 30.0–60.0)
CD8 T CELL ABS: 750 /uL (ref 230–1000)
CD8TOX: 50 % — AB (ref 15.0–40.0)
RATIO: 0.6 — AB (ref 1.0–3.0)
TOTAL LYMPHOCYTE COUNT: 1500 /uL (ref 1000–4000)

## 2015-04-05 ENCOUNTER — Encounter: Payer: Self-pay | Admitting: *Deleted

## 2015-04-05 NOTE — Progress Notes (Addendum)
Patient ID: Anthony Rich, male   DOB: 06/07/1971, 44 y.o.   MRN: [id number]   HIV VL on 03/29/2015 resulted with 7787 copies. He was suppressed in April. He mentioned he had been adherent. He did received a flu vaccine which was administered AFTER I drew his blood. Concerned that he may have developed resistance. He does not have a working number and still did not have one at study visit. VL was drawn on study so we would need to redraw to 1) confirm and 2) genotype. I have emailed him in hopes that he responds. Contact pending. Tacey Heap RN

## 2015-04-07 ENCOUNTER — Encounter: Payer: Self-pay | Admitting: Infectious Diseases

## 2015-04-07 LAB — HIV-1 RNA QUANT-NO REFLEX-BLD: HIV 1 RNA VIRAL LOAD: 7787

## 2015-04-07 NOTE — Progress Notes (Signed)
Thank you :)

## 2015-05-13 ENCOUNTER — Telehealth: Payer: Self-pay | Admitting: *Deleted

## 2015-05-13 NOTE — Telephone Encounter (Signed)
Called the patient to try and schedule an appt per Dr Ninetta LightsHatcher and the patient number is not a working number.

## 2016-02-21 ENCOUNTER — Encounter (INDEPENDENT_AMBULATORY_CARE_PROVIDER_SITE_OTHER): Payer: Self-pay | Admitting: *Deleted

## 2016-02-21 VITALS — BP 147/96 | HR 88 | Temp 98.0°F | Resp 16 | Ht 67.5 in | Wt 276.2 lb

## 2016-02-21 DIAGNOSIS — Z006 Encounter for examination for normal comparison and control in clinical research program: Secondary | ICD-10-CM

## 2016-02-21 LAB — LIPID PANEL
Cholesterol: 151 mg/dL (ref 125–200)
HDL: 32 mg/dL — ABNORMAL LOW (ref >=40)
LDL CALC: 95 mg/dL (ref <130)
Total CHOL/HDL Ratio: 4.7 Ratio (ref <=5.0)
Triglycerides: 119 mg/dL (ref <150)
VLDL: 24 mg/dL (ref <30)

## 2016-02-21 LAB — CD4/CD8 (T-HELPER/T-SUPPRESSOR CELL)
CD4%: 27.4
CD4: 384
CD8 T CELL SUPPRESSOR: 39.8
CD8: 557

## 2016-02-21 LAB — COMPREHENSIVE METABOLIC PANEL
ALBUMIN: 3.9 g/dL (ref 3.6–5.1)
ALK PHOS: 88 U/L (ref 40–115)
ALT: 36 U/L (ref 9–46)
AST: 22 U/L (ref 10–40)
BILIRUBIN TOTAL: 0.3 mg/dL (ref 0.2–1.2)
BUN: 11 mg/dL (ref 7–25)
CO2: 30 mmol/L (ref 20–31)
CREATININE: 0.91 mg/dL (ref 0.60–1.35)
Calcium: 8.9 mg/dL (ref 8.6–10.3)
Chloride: 101 mmol/L (ref 98–110)
Glucose, Bld: 111 mg/dL — ABNORMAL HIGH (ref 65–99)
Potassium: 4.4 mmol/L (ref 3.5–5.3)
SODIUM: 136 mmol/L (ref 135–146)
TOTAL PROTEIN: 8.7 g/dL — AB (ref 6.1–8.1)

## 2016-02-21 LAB — HIV-1 RNA QUANT-NO REFLEX-BLD: HIV-1 RNA Viral Load: 20527

## 2016-02-21 NOTE — Addendum Note (Signed)
Addended by: Phill MyronEPPERSON, KIMBERLY C on: 02/21/2016 02:21 PM   Modules accepted: Orders

## 2016-02-21 NOTE — Progress Notes (Signed)
Anthony FavreClarence has come in for research visits after being gone for almost a year. He had lost his job and insurance and moved back in with his mother in [place]. He now has a job Education officer, museumand insurance and is willing to get back in care here and resume study participation. He had stopped his ARVs last summer and didn't realize he could have gotten them through [facility]. He said he ran out of the Reprieve study medication in January. He denies any new health problems but does admit to being in the ED a few weeks ago in [place] for "acting strange" per his family. He has put on weight since last year and his BP is up at 147/96. I have scheduled him an appintment with the next available MD visit in September with Dr. Luciana Axeomer. All his study labs should be back by then. He has a return research visit for January.

## 2016-02-22 LAB — HEMOGLOBIN A1C
HEMOGLOBIN A1C: 5.9 % — AB (ref <5.7)
Mean Plasma Glucose: 123 mg/dL

## 2016-02-22 LAB — PROTEIN, URINE, RANDOM: Total Protein, Urine: 19 mg/dL (ref 5–25)

## 2016-02-22 LAB — HEPATITIS C ANTIBODY

## 2016-02-22 LAB — CREATININE, URINE, RANDOM: CREATININE, URINE: 179 mg/dL (ref 20–370)

## 2016-02-23 LAB — HEPATITIS C ANTIBODY: HCV AB: NEGATIVE

## 2016-03-13 ENCOUNTER — Encounter: Payer: Self-pay | Admitting: Internal Medicine

## 2016-03-13 ENCOUNTER — Ambulatory Visit (INDEPENDENT_AMBULATORY_CARE_PROVIDER_SITE_OTHER): Payer: Self-pay | Admitting: Internal Medicine

## 2016-03-13 ENCOUNTER — Encounter: Payer: Self-pay | Admitting: *Deleted

## 2016-03-13 VITALS — BP 159/100 | HR 9 | Temp 98.1°F | Ht 68.0 in | Wt 280.0 lb

## 2016-03-13 DIAGNOSIS — F32A Depression, unspecified: Secondary | ICD-10-CM

## 2016-03-13 DIAGNOSIS — B20 Human immunodeficiency virus [HIV] disease: Secondary | ICD-10-CM

## 2016-03-13 DIAGNOSIS — F329 Major depressive disorder, single episode, unspecified: Secondary | ICD-10-CM

## 2016-03-13 DIAGNOSIS — Z006 Encounter for examination for normal comparison and control in clinical research program: Secondary | ICD-10-CM

## 2016-03-13 DIAGNOSIS — Z23 Encounter for immunization: Secondary | ICD-10-CM

## 2016-03-13 DIAGNOSIS — Z113 Encounter for screening for infections with a predominantly sexual mode of transmission: Secondary | ICD-10-CM

## 2016-03-13 MED ORDER — ELVITEG-COBIC-EMTRICIT-TENOFAF 150-150-200-10 MG PO TABS: 1.0000 | ORAL_TABLET | Freq: Every day | ORAL | 5 refills | Status: DC

## 2016-03-13 NOTE — Assessment & Plan Note (Signed)
I discussed different options inclyuding newer regimens and will start Genvoya.  Labs today to see what cd4 and viral load are and rtc 4 weeks for labs on treatment and follow up with PharmD then to see if he is doing well and then fu with me in 4 months.

## 2016-03-13 NOTE — Assessment & Plan Note (Signed)
Will screen today 

## 2016-03-13 NOTE — Progress Notes (Signed)
CC: Follow up for HIV  Interval history: Returns to care after a prolonged absence.  He was last seen by a provider in April 2016 on Sustiva and Epzicom and was doing well.  He was also in the Reprieve study on pitivastatin but missed a year of follow up.  He had moved to [place] and lost his insurance and did not realize there is a drug assistance program.  He otherwise has had no hospitalizations, no weight loss, no new issues.  Has not been sexually active.  Occasional genital rash relieved with antifungal.  Ready to get back on treatment.   Prior to Admission medications   Medication Sig Start Date End Date Taking? Authorizing Provider  albuterol (PROVENTIL HFA;VENTOLIN HFA) 108 (90 BASE) MCG/ACT inhaler Inhale 2 puffs into the lungs every 6 (six) hours as needed. wheezing 10/19/14  Yes Ginnie SmartJeffrey C Hatcher, MD  budesonide-formoterol [facility](SYMBICORT) 160-4.5 MCG/ACT inhaler Inhale 2 puffs into the lungs 2 (two) times daily. 10/19/14  Yes Ginnie SmartJeffrey C Hatcher, MD  elvitegravir-cobicistat-emtricitabine-tenofovir (GENVOYA) 150-150-200-10 MG TABS tablet Take 1 tablet by mouth daily. 03/13/16   Gardiner Barefootobert W Sallie Maker, MD    Review of Systems Constitutional: negative for fatigue and malaise Gastrointestinal: negative for nausea and diarrhea Musculoskeletal: negative for myalgias and arthralgias All other systems reviewed and are negative   Physical Exam: CONSTITUTIONAL:in no apparent distress  Vitals:   03/13/16 1432  BP: (!) 159/100  Pulse: (!) 9  Temp: 98.1 F (36.7 C)   Eyes: anicteric HENT: no thrush, no cervical lymphadenopathy Respiratory: Normal respiratory effort; CTA B Cardiovascular: RRR GI: obese, nt  Lab Results  Component Value Date   HIV1RNAQUANT <40 02/08/2012   HIV1RNAQUANT NOT DETECTED 05/15/2011   HIV1RNAQUANT 39 06/02/2010

## 2016-03-13 NOTE — Progress Notes (Signed)
Anthony FavreClarence is here for his month 6416 Reprieve study visit. He is ready to start back on study meds and we gave him a new prescription for the pitavastatin. He says he will start today. If he can get his new prescription for Genvoya filled today, he also said he would start back on those. He denies any new problems or concerns. He will return January 9th for all 3 study visits and an MD visit that day.

## 2016-03-13 NOTE — Assessment & Plan Note (Signed)
No current issues

## 2016-03-14 LAB — T-HELPER CELL (CD4) - (RCID CLINIC ONLY)
CD4 T CELL HELPER: 22 % — AB (ref 33–55)
CD4 T Cell Abs: 290 /uL — ABNORMAL LOW (ref 400–2700)

## 2016-03-14 LAB — URINE CYTOLOGY ANCILLARY ONLY
Chlamydia: NEGATIVE
NEISSERIA GONORRHEA: NEGATIVE

## 2016-03-14 LAB — RPR

## 2016-03-15 ENCOUNTER — Encounter: Payer: Self-pay | Admitting: Infectious Disease

## 2016-03-15 LAB — HIV-1 RNA ULTRAQUANT REFLEX TO GENTYP+
HIV 1 RNA Quant: 6578 copies/mL — ABNORMAL HIGH (ref <20)
HIV-1 RNA Quant, Log: 3.82 Log copies/mL — ABNORMAL HIGH (ref <1.30)

## 2016-03-16 ENCOUNTER — Telehealth: Payer: Self-pay | Admitting: *Deleted

## 2016-03-16 NOTE — Telephone Encounter (Signed)
Patient has been very noncompliant. He has actually not been followed by me personally as much as other providers in our practice. I have overseen his research visits.

## 2016-03-16 NOTE — Telephone Encounter (Signed)
Received message from [facility] asking for records as patient is requesting a new intake appointment with Dr. Monico Hoaraybould there. Patient just seen in research 9/11, made follow up appointments for research and Dr. Daiva EvesVan Dam. Providence LaniusHowell, Zachary GeorgeMichelle M, RN

## 2016-03-24 LAB — HIV-1 GENOTYPR PLUS

## 2016-04-18 ENCOUNTER — Other Ambulatory Visit: Payer: Self-pay

## 2016-04-25 ENCOUNTER — Ambulatory Visit: Payer: Self-pay

## 2016-04-28 ENCOUNTER — Other Ambulatory Visit: Payer: Self-pay

## 2016-05-08 ENCOUNTER — Other Ambulatory Visit: Payer: Self-pay

## 2016-07-10 ENCOUNTER — Encounter: Payer: Self-pay | Admitting: Infectious Disease

## 2016-07-10 ENCOUNTER — Encounter (INDEPENDENT_AMBULATORY_CARE_PROVIDER_SITE_OTHER): Payer: Self-pay | Admitting: *Deleted

## 2016-07-10 ENCOUNTER — Other Ambulatory Visit: Payer: Self-pay | Admitting: *Deleted

## 2016-07-10 VITALS — BP 121/78 | HR 87 | Temp 97.8°F | Wt 280.0 lb

## 2016-07-10 DIAGNOSIS — Z006 Encounter for examination for normal comparison and control in clinical research program: Secondary | ICD-10-CM

## 2016-07-10 DIAGNOSIS — I10 Essential (primary) hypertension: Secondary | ICD-10-CM

## 2016-07-10 LAB — PROTEIN, URINE, RANDOM: TOTAL PROTEIN, URINE: 27 mg/dL — AB (ref 5–25)

## 2016-07-10 LAB — HEPATITIS C ANTIBODY: HCV Ab: NEGATIVE

## 2016-07-10 LAB — LIPID PANEL
CHOLESTEROL: 172 mg/dL (ref <200)
HDL: 32 mg/dL — ABNORMAL LOW (ref >40)
LDL CALC: 112 mg/dL — AB (ref <100)
Total CHOL/HDL Ratio: 5.4 Ratio — ABNORMAL HIGH (ref <5.0)
Triglycerides: 140 mg/dL (ref <150)
VLDL: 28 mg/dL (ref <30)

## 2016-07-10 LAB — COMPREHENSIVE METABOLIC PANEL
ALK PHOS: 94 U/L (ref 40–115)
ALT: 38 U/L (ref 9–46)
AST: 22 U/L (ref 10–40)
Albumin: 3.9 g/dL (ref 3.6–5.1)
BUN: 10 mg/dL (ref 7–25)
CALCIUM: 9.1 mg/dL (ref 8.6–10.3)
CO2: 32 mmol/L — ABNORMAL HIGH (ref 20–31)
Chloride: 100 mmol/L (ref 98–110)
Creat: 0.87 mg/dL (ref 0.60–1.35)
Glucose, Bld: 122 mg/dL — ABNORMAL HIGH (ref 65–99)
POTASSIUM: 4.3 mmol/L (ref 3.5–5.3)
Sodium: 138 mmol/L (ref 135–146)
TOTAL PROTEIN: 8.7 g/dL — AB (ref 6.1–8.1)
Total Bilirubin: 0.4 mg/dL (ref 0.2–1.2)

## 2016-07-10 LAB — HEMOGLOBIN A1C
HEMOGLOBIN A1C: 6.1 % — AB (ref <5.7)
MEAN PLASMA GLUCOSE: 128 mg/dL

## 2016-07-10 LAB — CREATININE, URINE, RANDOM: CREATININE, URINE: 188 mg/dL (ref 20–370)

## 2016-07-10 NOTE — Progress Notes (Signed)
Anthony Rich is here for his week 192 visit for The HAILO Study: A Long Term follow-up of Older HIV-Infected Adults in the [facility], an observational study addressing the issues of aging, HIV infection and Inflammation and month 20 visit for Reprieve, A Randomized Trial to Prevent Vascular Events in HIV (study drug is Pitavastatin 4mg  or placebo)  He had resumed his HIV meds and study meds at the last visit and says he has been very adherent. He denies any muscle aches or pains, but did get diagnosed with Hypertension and was started on amlodipine by his primary doctor [place] in [place]. He is scheduled to see Dr. Ninetta LightsHatcher at the end of the month and again for Research in May.

## 2016-07-11 ENCOUNTER — Ambulatory Visit: Payer: Self-pay | Admitting: Internal Medicine

## 2016-07-31 ENCOUNTER — Ambulatory Visit: Payer: Self-pay | Admitting: Infectious Diseases

## 2016-09-22 ENCOUNTER — Other Ambulatory Visit: Payer: Self-pay | Admitting: Internal Medicine

## 2016-11-20 ENCOUNTER — Telehealth: Payer: Self-pay | Admitting: *Deleted

## 2016-11-20 NOTE — Telephone Encounter (Signed)
Patient in care at [facility].  THey are sending him to local ID to continue his HIV care.  They have sent signed release, will get entire chart Wednesday.  They are asking for last office note and labs.  Sent to [phone]. Andree CossHowell, Michelle M, RN

## 2016-11-28 ENCOUNTER — Encounter: Payer: Self-pay | Admitting: *Deleted

## 2016-12-04 ENCOUNTER — Ambulatory Visit: Payer: Self-pay | Admitting: Infectious Diseases

## 2016-12-18 ENCOUNTER — Other Ambulatory Visit (HOSPITAL_COMMUNITY)
Admission: RE | Admit: 2016-12-18 | Discharge: 2016-12-18 | Disposition: A | Discharge status: Home / Self Care | Payer: BLUE CROSS/BLUE SHIELD | Source: Ambulatory Visit | Attending: Infectious Diseases | Admitting: Infectious Diseases

## 2016-12-18 ENCOUNTER — Other Ambulatory Visit: Payer: Self-pay | Admitting: Pharmacist

## 2016-12-18 ENCOUNTER — Ambulatory Visit (INDEPENDENT_AMBULATORY_CARE_PROVIDER_SITE_OTHER): Payer: BLUE CROSS/BLUE SHIELD | Admitting: Pharmacist

## 2016-12-18 DIAGNOSIS — B20 Human immunodeficiency virus [HIV] disease: Secondary | ICD-10-CM | POA: Insufficient documentation

## 2016-12-18 DIAGNOSIS — Z79899 Other long term (current) drug therapy: Secondary | ICD-10-CM | POA: Diagnosis not present

## 2016-12-18 DIAGNOSIS — Z113 Encounter for screening for infections with a predominantly sexual mode of transmission: Secondary | ICD-10-CM

## 2016-12-18 DIAGNOSIS — Z21 Asymptomatic human immunodeficiency virus [HIV] infection status: Secondary | ICD-10-CM | POA: Diagnosis not present

## 2016-12-18 LAB — CBC
HCT: 39.7 % (ref 38.5–50.0)
Hemoglobin: 12.7 g/dL — ABNORMAL LOW (ref 13.2–17.1)
MCH: 25.7 pg — AB (ref 27.0–33.0)
MCHC: 32 g/dL (ref 32.0–36.0)
MCV: 80.2 fL (ref 80.0–100.0)
MPV: 9.1 fL (ref 7.5–12.5)
PLATELETS: 327 10*3/uL (ref 140–400)
RBC: 4.95 MIL/uL (ref 4.20–5.80)
RDW: 16.9 % — ABNORMAL HIGH (ref 11.0–15.0)
WBC: 7.1 10*3/uL (ref 3.8–10.8)

## 2016-12-18 MED ORDER — BICTEGRAVIR-EMTRICITAB-TENOFOV 50-200-25 MG PO TABS: 1.0000 | ORAL_TABLET | Freq: Every day | ORAL | 5 refills | Status: DC

## 2016-12-18 MED ORDER — ELVITEG-COBIC-EMTRICIT-TENOFAF 150-150-200-10 MG PO TABS: 1.0000 | ORAL_TABLET | Freq: Every day | ORAL | 4 refills | Status: DC

## 2016-12-18 NOTE — Addendum Note (Signed)
Addended by: Robinette HainesKUPPELWEISER, Saniyya Gau L on: 12/18/2016 04:26 PM   Modules accepted: Orders

## 2016-12-18 NOTE — Progress Notes (Signed)
HPI: Anthony Rich is a 46 y.o. male who presents to the [facility] pharmacy clinic for HIV follow-up after several no shows with the MD.   Allergies: No Known Allergies  Past Medical History: Past Medical History:  Diagnosis Date  . Asthma     Social History: Social History   Social History  . Marital status: Single    Spouse name: N/A  . Number of children: N/A  . Years of education: N/A   Social History Main Topics  . Smoking status: Never Smoker  . Smokeless tobacco: Never Used  . Alcohol use Yes     Comment: rarely  . Drug use: No  . Sexual activity: Not Currently    Partners: Male     Comment: pt. declined condoms   Other Topics Concern  . Not on file   Social History Narrative  . No narrative on file    Current Regimen: Genvoya  Labs: HIV 1 RNA Quant  Date Value  03/13/2016 6,578 copies/mL (H)  02/08/2012 <40  05/15/2011 NOT DETECTED copies/mL   HIV-1 RNA Viral Load (no units)  Date Value  02/21/2016 20,527  03/29/2015 7,787  10/19/2014 <40   CD4 (no units)  Date Value  02/21/2016 384  09/18/2014 508  07/28/2013 606   CD4 T Cell Abs (/uL)  Date Value  03/13/2016 290 (L)  12/01/2013 450   Hep B S Ab (no units)  Date Value  09/18/2014 POS (A)   Hepatitis B Surface Ag (no units)  Date Value  09/18/2014 NEGATIVE   HCV Ab (no units)  Date Value  07/10/2016 NEGATIVE    CrCl: CrCl cannot be calculated (Patient's most recent lab result is older than the maximum 21 days allowed.).  Lipids:    Component Value Date/Time   CHOL 172 07/10/2016 0950   TRIG 140 07/10/2016 0950   HDL 32 (L) 07/10/2016 0950   CHOLHDL 5.4 (H) 07/10/2016 0950   VLDL 28 07/10/2016 0950   LDLCALC 112 (H) 07/10/2016 0950    Assessment: Anthony Rich is here today for HIV follow-up.  He has not been seen by an MD since September 2017.  He states he has been taking his Genvoya every day. Not a single missed dose since last seen.  He does tell me that he was  hospitalized this month at [facility].  When asked why, he cannot tell me why - states he "forgot". He also states he has not been sexually active since then. No oral sex either.  I asked him if we could start filling his medications at [facility] to keep better track of him and he declined wishing to stay at [facility].  I would like to change him to [facility], in case he is missing doses as it has a higher barrier to resistance than Genvoya.  Also has less drug interactions.  He is requesting to follow-up with Dr. Ninetta Lights.  Explained our new no show policy rule and told him to make sure if he has to cancel to do so >24 hours before his appt.  I asked him why he has cancelled/no showed so many times and he cannot give me a reason.  Will change him to [employer] - educated him on how to take it and side effects.  Encouraged him not to miss any doses.  Will do labs today and see him back in ~1 month.   Plans: - Stop Genvoya - Start Biktarvy 1 pill PO once daily - HIV VL with genotype, CD4, CBC,  BMET, urine G/C cytology, RPR today - F/u with me again for adherence and labs 7/24 at 2pm - F/u with Anthony Rich 8/8 at 11:15am  Anthony Rich, PharmD, CPP Infectious Diseases Clinical Pharmacist [facility] 12/18/2016, 3:06 PM   Addendum: [facility] called - [employer] not covering Biktarvy. Not even giving prior auth as a solution.  Will send in Genvoya instead. Patient aware.  Anthony Rich, PharmD, CPP Infectious Diseases Clinical Pharmacist [facility] 12/18/2016, 4:25 PM

## 2016-12-18 NOTE — Patient Instructions (Addendum)
You will start on a new medication called Biktarvy.  Please stop taking the Genvoya when you pick up the Biktarvy.  Take this once daily.   You have a follow up appointment with the pharmacist on Tuesday, July 24th at 2pm.  You also have a follow up appointment with Dr. Ninetta LightsHatcher on Wednesday, August 8th at 11:15am. Please call at least 24 hours in advance if you need to reschedule.

## 2016-12-19 LAB — BASIC METABOLIC PANEL
BUN: 5 mg/dL — ABNORMAL LOW (ref 7–25)
CHLORIDE: 103 mmol/L (ref 98–110)
CO2: 26 mmol/L (ref 20–31)
CREATININE: 0.86 mg/dL (ref 0.60–1.35)
Calcium: 9.2 mg/dL (ref 8.6–10.3)
Glucose, Bld: 82 mg/dL (ref 65–99)
POTASSIUM: 3.9 mmol/L (ref 3.5–5.3)
SODIUM: 139 mmol/L (ref 135–146)

## 2016-12-19 LAB — RPR

## 2016-12-19 LAB — URINE CYTOLOGY ANCILLARY ONLY
CHLAMYDIA, DNA PROBE: NEGATIVE
NEISSERIA GONORRHEA: NEGATIVE

## 2016-12-19 LAB — T-HELPER CELL (CD4) - (RCID CLINIC ONLY)
CD4 T CELL ABS: 300 /uL — AB (ref 400–2700)
CD4 T CELL HELPER: 25 % — AB (ref 33–55)

## 2016-12-22 LAB — HIV RNA, RTPCR W/R GT (RTI, PI,INT)
HIV-1 RNA, QN PCR: DETECTED {Log_copies}/mL
HIV-1 RNA, QN PCR: DETECTED {copies}/mL

## 2016-12-26 ENCOUNTER — Telehealth: Payer: Self-pay | Admitting: *Deleted

## 2016-12-26 NOTE — Telephone Encounter (Signed)
Signed record request received from [facility] for last progress note and lab results. Faxed as requested. Andree CossHowell, Raylee Adamec M, RN

## 2017-01-23 ENCOUNTER — Ambulatory Visit (INDEPENDENT_AMBULATORY_CARE_PROVIDER_SITE_OTHER): Payer: BLUE CROSS/BLUE SHIELD | Admitting: Pharmacist

## 2017-01-23 ENCOUNTER — Encounter (INDEPENDENT_AMBULATORY_CARE_PROVIDER_SITE_OTHER): Payer: BLUE CROSS/BLUE SHIELD | Admitting: *Deleted

## 2017-01-23 VITALS — BP 118/76 | HR 90 | Temp 98.3°F | Wt 273.5 lb

## 2017-01-23 DIAGNOSIS — Z006 Encounter for examination for normal comparison and control in clinical research program: Secondary | ICD-10-CM

## 2017-01-23 DIAGNOSIS — B20 Human immunodeficiency virus [HIV] disease: Secondary | ICD-10-CM

## 2017-01-23 NOTE — Progress Notes (Signed)
HPI: Anthony Rich is a 46 y.o. male who presents to the [facility] pharmacy clinic for HIV follow-up.   Allergies: No Known Allergies  Past Medical History: Past Medical History:  Diagnosis Date  . Asthma     Social History: Social History   Social History  . Marital status: Single    Spouse name: N/A  . Number of children: N/A  . Years of education: N/A   Social History Main Topics  . Smoking status: Never Smoker  . Smokeless tobacco: Never Used  . Alcohol use Yes     Comment: rarely  . Drug use: No  . Sexual activity: Not Currently    Partners: Male     Comment: pt. declined condoms   Other Topics Concern  . Not on file   Social History Narrative  . No narrative on file    Current Regimen: Genvoya  Labs: HIV 1 RNA Quant  Date Value  03/13/2016 6,578 copies/mL (H)  02/08/2012 <40  05/15/2011 NOT DETECTED copies/mL   HIV-1 RNA Viral Load (no units)  Date Value  02/21/2016 20,527  03/29/2015 7,787  10/19/2014 <40   CD4 (no units)  Date Value  02/21/2016 384  09/18/2014 508  07/28/2013 606   CD4 T Cell Abs (/uL)  Date Value  12/18/2016 300 (L)  03/13/2016 290 (L)  12/01/2013 450   Hep B S Ab (no units)  Date Value  09/18/2014 POS (A)   Hepatitis B Surface Ag (no units)  Date Value  09/18/2014 NEGATIVE   HCV Ab (no units)  Date Value  07/10/2016 NEGATIVE    CrCl: CrCl cannot be calculated (Patient's most recent lab result is older than the maximum 21 days allowed.).  Lipids:    Component Value Date/Time   CHOL 172 07/10/2016 0950   TRIG 140 07/10/2016 0950   HDL 32 (L) 07/10/2016 0950   CHOLHDL 5.4 (H) 07/10/2016 0950   VLDL 28 07/10/2016 0950   LDLCALC 112 (H) 07/10/2016 0950    Assessment: Anthony Rich is still doing well on his HIV regimen, Genvoya.  His labs looked good from last month with his HIV viral load undetectable.  He states he hasn't missed any doses in the last month. No side effects either. He will see Anthony Rich with  research next.   Plans: - Continue Genvoya - F/u with Dr. Ninetta LightsHatcher 8/8 at 11:15am  Cassie L. Kuppelweiser, PharmD, CPP Infectious Diseases Clinical Pharmacist [facility] 01/23/2017, 2:54 PM

## 2017-01-23 NOTE — Progress Notes (Signed)
Anthony FavreClarence is here for his week 216 HAILO study visit and month 24 for Reprieve. He had missed his last Reprieve visit due in May he said because he was in a mental hospital in [place]. He said he was in the [facility] from 5/16 until 6/15. He could not tell me why except that he needed to get his meds regulated. He is now on Lithium and Risperdal. I will be requesting records on him. He denies any current problems, no muscle aches or weakness. He said he didn't have his study meds in the hospital and had started them back but when asked about taking them, he only admitted to taking them a half of the time over the past week. We discussed the importance of adherence and taking all his meds as instructed. He will be returning in October for the next Reprieve visit and [facility] due around December/january.

## 2017-02-07 ENCOUNTER — Encounter: Payer: Self-pay | Admitting: Infectious Diseases

## 2017-02-07 ENCOUNTER — Ambulatory Visit (INDEPENDENT_AMBULATORY_CARE_PROVIDER_SITE_OTHER): Payer: BLUE CROSS/BLUE SHIELD | Admitting: Infectious Diseases

## 2017-02-07 VITALS — BP 117/77 | HR 94 | Temp 99.0°F | Wt 272.8 lb

## 2017-02-07 DIAGNOSIS — B20 Human immunodeficiency virus [HIV] disease: Secondary | ICD-10-CM | POA: Diagnosis not present

## 2017-02-07 DIAGNOSIS — Z789 Other specified health status: Secondary | ICD-10-CM | POA: Diagnosis not present

## 2017-02-07 DIAGNOSIS — H04209 Unspecified epiphora, unspecified lacrimal gland: Secondary | ICD-10-CM | POA: Insufficient documentation

## 2017-02-07 DIAGNOSIS — F203 Undifferentiated schizophrenia: Secondary | ICD-10-CM

## 2017-02-07 DIAGNOSIS — F209 Schizophrenia, unspecified: Secondary | ICD-10-CM | POA: Insufficient documentation

## 2017-02-07 DIAGNOSIS — Z23 Encounter for immunization: Secondary | ICD-10-CM | POA: Diagnosis not present

## 2017-02-07 DIAGNOSIS — H04203 Unspecified epiphora, bilateral lacrimal glands: Secondary | ICD-10-CM

## 2017-02-07 LAB — HIV-1 RNA QUANT-NO REFLEX-BLD: HIV-1 RNA Viral Load: <40

## 2017-02-07 MED ORDER — CROMOLYN SODIUM 4 % OP SOLN: 2.0000 [drp] | Freq: Four times a day (QID) | OPHTHALMIC | Status: AC

## 2017-02-07 MED ORDER — RISPERIDONE 2 MG PO TABS: 2.0000 mg | ORAL_TABLET | Freq: Every day | ORAL | Status: AC

## 2017-02-07 MED ORDER — LITHIUM CARBONATE 600 MG PO CAPS: 600.0000 mg | ORAL_CAPSULE | Freq: Two times a day (BID) | ORAL | Status: AC

## 2017-02-07 NOTE — Assessment & Plan Note (Signed)
Will add his psych meds to his list as given in his mental health d/c.

## 2017-02-07 NOTE — Assessment & Plan Note (Signed)
He appears to be doing well Mening vax today Will see him back in 4-5 months as he has been out of care.  Has close care with his family.

## 2017-02-07 NOTE — Progress Notes (Signed)
   Subjective:    Patient ID: Anthony AlpersClarence Easter Jr, male    DOB: 11/25/1970, 46 y.o.   MRN: [id number]  HPI 46 yo M with HIV+, has been followed at [facility], previously on epzicom/sustiva. Changed to genvoya 03-2016.   Depression has been better. Hep B immune.  HIV RNA pending, CD4 300 (12-19-14) [facility].   He was involuntarily hospitalized for 1 month for mental health issues (May 16 to December 15, 2016. He was dx with schizophrenia and schizoaffective bipolar d/o. He has been started on lithium but is unclear of dose.   Has been feeling "good". C/o watering eyes. Was given eye drops (? Name) and had only transient improvement.  No problems with ART.   HIV 1 RNA Quant  Date Value  03/13/2016 6,578 copies/mL (H)  02/08/2012 <40  05/15/2011 NOT DETECTED copies/mL   HIV-1 RNA Viral Load (no units)  Date Value  02/21/2016 20,527  03/29/2015 7,787  10/19/2014 <40   CD4 (no units)  Date Value  02/21/2016 384  09/18/2014 508  07/28/2013 606   CD4 T Cell Abs (/uL)  Date Value  12/18/2016 300 (L)  03/13/2016 290 (L)  12/01/2013 450    Review of Systems  Constitutional: Negative for appetite change, chills, fever and unexpected weight change.  Respiratory: Negative for cough and shortness of breath.   Gastrointestinal: Negative for constipation and diarrhea.  Genitourinary: Negative for difficulty urinating.  Psychiatric/Behavioral: Negative for hallucinations, self-injury, sleep disturbance and suicidal ideas.   Hasn't needed albuterol rescue inhaler.     Objective:   Physical Exam  Constitutional: He appears well-developed and well-nourished.  HENT:  Mouth/Throat: No oropharyngeal exudate.  Eyes: Pupils are equal, round, and reactive to light. EOM are normal.  Neck: Neck supple.  Cardiovascular: Normal rate, regular rhythm and normal heart sounds.   Pulmonary/Chest: Effort normal and breath sounds normal.  Abdominal: Soft. Bowel sounds are normal. There is no tenderness. There is  no rebound.  Musculoskeletal: He exhibits edema.  Lymphadenopathy:    He has no cervical adenopathy.  Psychiatric: His mood appears not anxious. His affect is blunt. His affect is not angry, not labile and not inappropriate. He does not exhibit a depressed mood.       Assessment & Plan:

## 2017-02-07 NOTE — Assessment & Plan Note (Signed)
He has conjunctival injection but no d/c.  Will give him cromylyn.

## 2017-02-07 NOTE — Addendum Note (Signed)
Addended by: Lorenso CourierMALDONADO, JOSE L on: 02/07/2017 10:49 AM   Modules accepted: Orders

## 2017-02-14 ENCOUNTER — Encounter: Payer: Self-pay | Admitting: *Deleted

## 2017-04-11 ENCOUNTER — Encounter: Payer: BLUE CROSS/BLUE SHIELD | Admitting: *Deleted

## 2017-04-25 ENCOUNTER — Encounter: Payer: BLUE CROSS/BLUE SHIELD | Admitting: *Deleted

## 2017-05-08 ENCOUNTER — Encounter (INDEPENDENT_AMBULATORY_CARE_PROVIDER_SITE_OTHER): Payer: BLUE CROSS/BLUE SHIELD | Admitting: *Deleted

## 2017-05-08 VITALS — BP 131/91 | HR 90 | Temp 97.8°F | Wt 276.8 lb

## 2017-05-08 DIAGNOSIS — Z006 Encounter for examination for normal comparison and control in clinical research program: Secondary | ICD-10-CM

## 2017-05-08 NOTE — Progress Notes (Signed)
Marilu FavreClarence is here for his month 28 visit for Reprieve, A Randomized Trial to Prevent Vascular Events in HIV (study drug is Pitavastatin 4mg  or placebo). He denies any new problems or concerns. He says he is continuing on his psych meds and has been very adherent with them, his HIV meds and study med. He will be returning in January for the next appt.

## 2017-06-18 ENCOUNTER — Ambulatory Visit: Payer: BLUE CROSS/BLUE SHIELD | Admitting: Infectious Diseases

## 2017-07-09 ENCOUNTER — Ambulatory Visit: Payer: BLUE CROSS/BLUE SHIELD | Admitting: Infectious Diseases

## 2017-07-17 ENCOUNTER — Ambulatory Visit: Payer: BLUE CROSS/BLUE SHIELD | Admitting: Infectious Diseases

## 2017-07-18 ENCOUNTER — Ambulatory Visit: Payer: BLUE CROSS/BLUE SHIELD | Admitting: Infectious Diseases

## 2017-07-25 ENCOUNTER — Encounter: Payer: BLUE CROSS/BLUE SHIELD | Admitting: *Deleted

## 2017-07-30 ENCOUNTER — Ambulatory Visit: Payer: BLUE CROSS/BLUE SHIELD | Admitting: Infectious Diseases

## 2017-08-01 ENCOUNTER — Ambulatory Visit: Payer: BLUE CROSS/BLUE SHIELD | Admitting: Infectious Diseases

## 2017-08-01 ENCOUNTER — Encounter: Payer: BLUE CROSS/BLUE SHIELD | Admitting: *Deleted

## 2017-08-08 ENCOUNTER — Ambulatory Visit: Payer: BLUE CROSS/BLUE SHIELD | Admitting: Infectious Diseases

## 2017-08-08 ENCOUNTER — Encounter: Payer: BLUE CROSS/BLUE SHIELD | Admitting: *Deleted

## 2017-08-20 ENCOUNTER — Ambulatory Visit (INDEPENDENT_AMBULATORY_CARE_PROVIDER_SITE_OTHER): Payer: BLUE CROSS/BLUE SHIELD | Admitting: Infectious Diseases

## 2017-08-20 ENCOUNTER — Encounter (INDEPENDENT_AMBULATORY_CARE_PROVIDER_SITE_OTHER): Payer: BLUE CROSS/BLUE SHIELD | Admitting: *Deleted

## 2017-08-20 VITALS — BP 117/77 | HR 84 | Temp 98.0°F | Ht 67.0 in | Wt 279.5 lb

## 2017-08-20 DIAGNOSIS — B20 Human immunodeficiency virus [HIV] disease: Secondary | ICD-10-CM

## 2017-08-20 DIAGNOSIS — L851 Acquired keratosis [keratoderma] palmaris et plantaris: Secondary | ICD-10-CM

## 2017-08-20 DIAGNOSIS — F203 Undifferentiated schizophrenia: Secondary | ICD-10-CM

## 2017-08-20 DIAGNOSIS — Z006 Encounter for examination for normal comparison and control in clinical research program: Secondary | ICD-10-CM

## 2017-08-20 MED ORDER — HYDROXYZINE HCL 10 MG PO TABS: 10.0000 mg | ORAL_TABLET | Freq: Three times a day (TID) | ORAL | 1 refills | Status: AC | PRN

## 2017-08-20 NOTE — Assessment & Plan Note (Signed)
He asks for rx for itching.  Will give him rx for atarax, cautioned him that it will make him sleepy.

## 2017-08-20 NOTE — Assessment & Plan Note (Signed)
Appreciate f/u with mental health provider.  No change in his rx for this.  Watch Cr on Li+

## 2017-08-20 NOTE — Progress Notes (Signed)
   Subjective:    Patient ID: Anthony Rich, male    DOB: 04/11/1971, 47 y.o.   MRN: [id number]  HPI 47 yo M with HIV+, has been followed at [facility], previously on epzicom/sustiva. Changed to genvoya 03-2016.   Depression has been better. Hep B immune.  HIV RNA pending, CD4 300 (12-19-14) [facility].  Had blood work done this AM.  No missed ART.  He was involuntarily hospitalized for 1 month for mental health issues (May 16 to December 15, 2016. He was dx with schizophrenia and schizoaffective bipolar d/o. He remains on lithium and has been feeling well.     HIV 1 RNA Quant  Date Value  03/13/2016 6,578 copies/mL (H)  02/08/2012 <40  05/15/2011 NOT DETECTED copies/mL   HIV-1 RNA Viral Load (no units)  Date Value  01/23/2017 <40  02/21/2016 20,527  03/29/2015 7,787   CD4 (no units)  Date Value  02/21/2016 384  09/18/2014 508  07/28/2013 606   CD4 T Cell Abs (/uL)  Date Value  12/18/2016 300 (L)  03/13/2016 290 (L)  12/01/2013 450    Review of Systems  Constitutional: Negative for appetite change and unexpected weight change.  Respiratory: Negative for cough and shortness of breath.   Gastrointestinal: Negative for constipation and diarrhea.  Endocrine: Positive for polyuria.  Genitourinary: Negative for difficulty urinating.  Skin: Positive for rash.       pruritis  Neurological: Negative for dizziness and headaches.  Psychiatric/Behavioral: Negative for dysphoric mood and sleep disturbance.   Working on losing wt- dieting.  Please see HPI. All other systems reviewed and negative.     Objective:   Physical Exam  Constitutional: He appears well-developed and well-nourished.  HENT:  Mouth/Throat: No oropharyngeal exudate.  Neck: Neck supple.  Cardiovascular: Normal rate, regular rhythm and normal heart sounds.  Pulmonary/Chest: Effort normal and breath sounds normal.  Abdominal: Soft. Bowel sounds are normal. There is no tenderness. There is no rebound.    Musculoskeletal: He exhibits no edema.  Lymphadenopathy:    He has no cervical adenopathy.  Psychiatric: He has a normal mood and affect.      Assessment & Plan:

## 2017-08-20 NOTE — Assessment & Plan Note (Signed)
Encouraged him to diet and exercise.  

## 2017-08-20 NOTE — Progress Notes (Signed)
Anthony FavreClarence is here for his week 240 visit for A5322 and month 32 for Reprievwe. He says he is doing fine and doesn't have any new issues. When asked about his adherence he says it is good. He will be seeing Dr. Ninetta LightsHatcher this afternoon.

## 2017-08-20 NOTE — Assessment & Plan Note (Signed)
Appears to be doing well Await labs from [facility] Offered/refused condoms.  Has gotten flu shot.  rtc in 9 months

## 2017-08-21 LAB — PROTEIN / CREATININE RATIO, URINE
Creatinine, Urine: 194 mg/dL (ref 20–320)
Protein/Creat Ratio: 108 mg/g creat (ref 22–128)
Total Protein, Urine: 21 mg/dL (ref 5–25)

## 2017-08-21 LAB — COMPREHENSIVE METABOLIC PANEL
AG Ratio: 1.4 (calc) (ref 1.0–2.5)
ALBUMIN MSPROF: 4.6 g/dL (ref 3.6–5.1)
ALKALINE PHOSPHATASE (APISO): 93 U/L (ref 40–115)
ALT: 28 U/L (ref 9–46)
AST: 22 U/L (ref 10–40)
BUN: 9 mg/dL (ref 7–25)
CO2: 27 mmol/L (ref 20–32)
CREATININE: 0.93 mg/dL (ref 0.60–1.35)
Calcium: 9.3 mg/dL (ref 8.6–10.3)
Chloride: 105 mmol/L (ref 98–110)
Globulin: 3.3 g/dL (calc) (ref 1.9–3.7)
Glucose, Bld: 112 mg/dL — ABNORMAL HIGH (ref 65–99)
Potassium: 4 mmol/L (ref 3.5–5.3)
Sodium: 138 mmol/L (ref 135–146)
Total Bilirubin: 0.3 mg/dL (ref 0.2–1.2)
Total Protein: 7.9 g/dL (ref 6.1–8.1)

## 2017-08-21 LAB — LIPID PANEL
CHOL/HDL RATIO: 5.3 (calc) — AB (ref <5.0)
CHOLESTEROL: 163 mg/dL (ref <200)
HDL: 31 mg/dL — ABNORMAL LOW (ref >40)
LDL CHOLESTEROL (CALC): 102 mg/dL — AB
Non-HDL Cholesterol (Calc): 132 mg/dL (calc) — ABNORMAL HIGH (ref <130)
TRIGLYCERIDES: 179 mg/dL — AB (ref <150)

## 2017-08-21 LAB — HEMOGLOBIN A1C
HEMOGLOBIN A1C: 5.9 %{Hb} — AB (ref <5.7)
MEAN PLASMA GLUCOSE: 123 (calc)
eAG (mmol/L): 6.8 (calc)

## 2017-08-22 LAB — CD4/CD8 (T-HELPER/T-SUPPRESSOR CELL)
CD4 Count: 452
CD4 T CELL HELPER: 30.1
CD8 % Suppressor T Cell: 31.7
CD8 T CELL ABS: 476

## 2017-09-17 LAB — HIV-1 RNA QUANT-NO REFLEX-BLD: HIV-1 RNA Viral Load: <40

## 2017-09-18 ENCOUNTER — Other Ambulatory Visit: Payer: Self-pay | Admitting: Pharmacist

## 2017-09-18 DIAGNOSIS — Z21 Asymptomatic human immunodeficiency virus [HIV] infection status: Secondary | ICD-10-CM

## 2017-09-18 DIAGNOSIS — B20 Human immunodeficiency virus [HIV] disease: Secondary | ICD-10-CM

## 2017-09-18 DIAGNOSIS — Z79899 Other long term (current) drug therapy: Secondary | ICD-10-CM

## 2017-09-25 ENCOUNTER — Encounter: Payer: Self-pay | Admitting: *Deleted

## 2017-11-29 ENCOUNTER — Encounter: Payer: BLUE CROSS/BLUE SHIELD | Admitting: *Deleted

## 2017-12-12 ENCOUNTER — Encounter: Payer: Self-pay | Admitting: Infectious Diseases

## 2017-12-12 ENCOUNTER — Encounter (INDEPENDENT_AMBULATORY_CARE_PROVIDER_SITE_OTHER): Payer: Self-pay | Admitting: *Deleted

## 2017-12-12 VITALS — BP 117/79 | HR 90 | Temp 97.8°F | Wt 276.2 lb

## 2017-12-12 DIAGNOSIS — Z006 Encounter for examination for normal comparison and control in clinical research program: Secondary | ICD-10-CM

## 2017-12-12 NOTE — Progress Notes (Signed)
Anthony FavreClarence is here for his month 436 Reprieve visit and his week 264 visit for A5322. He denies any new problems or medications. He says his adherence is very good to both study meds and his HIV meds. He continues on his psych meds. He has an appt. With Dr. Ninetta Rich in November and will be returning in October to see Anthony Rich at which time we will be getting his labs.

## 2018-01-10 ENCOUNTER — Encounter: Payer: Self-pay | Admitting: *Deleted

## 2018-01-10 LAB — HIV-1 RNA QUANT-NO REFLEX-BLD: HIV-1 RNA Viral Load: <40

## 2018-04-11 ENCOUNTER — Encounter (INDEPENDENT_AMBULATORY_CARE_PROVIDER_SITE_OTHER): Payer: BLUE CROSS/BLUE SHIELD | Admitting: *Deleted

## 2018-04-11 VITALS — BP 132/79 | HR 84 | Temp 97.9°F | Ht 67.75 in | Wt 275.0 lb

## 2018-04-11 DIAGNOSIS — Z006 Encounter for examination for normal comparison and control in clinical research program: Secondary | ICD-10-CM

## 2018-04-11 NOTE — Research (Signed)
Anthony Rich was here for his month 66 Reprieve visit and his week 288 visit for The HAILO Study: A Long Term follow-up of Older HIV-Infected Adults in the [facility], an observational study addressing the issues of aging, HIV infection and Inflammation  He denies any new problems or concerns. He continues to take his study med and his ARV without problems and continues on his psych meds. He denies any problems with focusing or paranoid thoughts. 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 He will be returning in a few weeks to see Dr. Ninetta Lights.

## 2018-04-12 LAB — COMPREHENSIVE METABOLIC PANEL
AG Ratio: 1.4 (calc) (ref 1.0–2.5)
ALKALINE PHOSPHATASE (APISO): 74 U/L (ref 40–115)
ALT: 24 U/L (ref 9–46)
AST: 12 U/L (ref 10–40)
Albumin: 4.4 g/dL (ref 3.6–5.1)
BILIRUBIN TOTAL: 0.4 mg/dL (ref 0.2–1.2)
BUN: 13 mg/dL (ref 7–25)
CALCIUM: 9.8 mg/dL (ref 8.6–10.3)
CO2: 26 mmol/L (ref 20–32)
Chloride: 103 mmol/L (ref 98–110)
Creat: 0.98 mg/dL (ref 0.60–1.35)
Globulin: 3.1 g/dL (calc) (ref 1.9–3.7)
Glucose, Bld: 109 mg/dL — ABNORMAL HIGH (ref 65–99)
Potassium: 4.2 mmol/L (ref 3.5–5.3)
SODIUM: 139 mmol/L (ref 135–146)
TOTAL PROTEIN: 7.5 g/dL (ref 6.1–8.1)

## 2018-04-12 LAB — LIPID PANEL
Cholesterol: 176 mg/dL (ref <200)
HDL: 54 mg/dL (ref >40)
LDL CHOLESTEROL (CALC): 102 mg/dL — AB
Non-HDL Cholesterol (Calc): 122 mg/dL (calc) (ref <130)
TRIGLYCERIDES: 108 mg/dL (ref <150)
Total CHOL/HDL Ratio: 3.3 (calc) (ref <5.0)

## 2018-04-12 LAB — HEPATITIS C ANTIBODY
HEP C AB: NONREACTIVE
SIGNAL TO CUT-OFF: 0.05 (ref <1.00)

## 2018-04-12 LAB — HEMOGLOBIN A1C
Hgb A1c MFr Bld: 5.7 % of total Hgb — ABNORMAL HIGH (ref <5.7)
Mean Plasma Glucose: 117 (calc)
eAG (mmol/L): 6.5 (calc)

## 2018-04-12 LAB — PROTEIN / CREATININE RATIO, URINE
CREATININE, URINE: 91 mg/dL (ref 20–320)
PROTEIN/CREAT RATIO: 165 mg/g{creat} — AB (ref 22–128)
TOTAL PROTEIN, URINE: 15 mg/dL (ref 5–25)

## 2018-05-17 ENCOUNTER — Encounter: Payer: Self-pay | Admitting: *Deleted

## 2018-05-17 LAB — HIV-1 RNA QUANT-NO REFLEX-BLD
CD4 % Helper T Cell: 31.3
CD4 Count: 347
CD8 % Suppressor T Cell: 30.7
CD8 T CELL ABS: 368
HIV-1 RNA Viral Load: <40

## 2018-05-21 ENCOUNTER — Ambulatory Visit: Payer: BLUE CROSS/BLUE SHIELD | Admitting: Infectious Diseases

## 2018-07-04 ENCOUNTER — Telehealth: Payer: Self-pay | Admitting: *Deleted

## 2018-07-04 NOTE — Telephone Encounter (Signed)
RN received request via covermymeds for authorization on Genvoya. Patient's demographics in cover[place]ymeds [place] address, [place] has [place] address. RN called patient to clarify, call was not answered and patient's voicemail is not yet set up.  He is overdue for a visit with Dr Ninetta Lights.  Will notify research as well. RN completed prior authorization via covermymeds.  Should receive response in 72 hours. Andree Coss, RN

## 2018-07-26 ENCOUNTER — Telehealth: Payer: Self-pay

## 2018-07-26 ENCOUNTER — Other Ambulatory Visit: Payer: Self-pay | Admitting: Infectious Diseases

## 2018-07-26 DIAGNOSIS — Z21 Asymptomatic human immunodeficiency virus [HIV] infection status: Secondary | ICD-10-CM

## 2018-07-26 DIAGNOSIS — B20 Human immunodeficiency virus [HIV] disease: Secondary | ICD-10-CM

## 2018-07-26 DIAGNOSIS — Z79899 Other long term (current) drug therapy: Secondary | ICD-10-CM

## 2018-07-26 NOTE — Telephone Encounter (Signed)
Received refill request from pharmacy for [facility]. Patient is overdue for follow-up appointment with DR. Hatcher. Call was placed to get in touch with patient,however, call was not answered. Unable to leave voicemail at this time. Patient is currently scheduled to see research 2/11. Lorenso Courier, [place]

## 2018-08-13 ENCOUNTER — Encounter: Payer: Self-pay | Admitting: *Deleted

## 2018-08-15 NOTE — Telephone Encounter (Signed)
Received notification from insurance for approval for Genvoya. Medication approved for coverage unit 07/05/2019 Valarie Cones, LPN

## 2018-08-27 ENCOUNTER — Encounter: Payer: Self-pay | Admitting: *Deleted

## 2018-09-04 ENCOUNTER — Other Ambulatory Visit: Payer: Self-pay | Admitting: Infectious Diseases

## 2018-09-04 DIAGNOSIS — Z79899 Other long term (current) drug therapy: Secondary | ICD-10-CM

## 2018-09-04 DIAGNOSIS — Z21 Asymptomatic human immunodeficiency virus [HIV] infection status: Secondary | ICD-10-CM

## 2018-09-04 DIAGNOSIS — B20 Human immunodeficiency virus [HIV] disease: Secondary | ICD-10-CM

## 2018-09-04 NOTE — Telephone Encounter (Signed)
Pending follow up appointment.

## 2018-09-09 ENCOUNTER — Encounter: Payer: Medicaid - Out of State | Admitting: *Deleted

## 2018-09-09 ENCOUNTER — Ambulatory Visit: Payer: Medicaid - Out of State | Admitting: Infectious Diseases

## 2018-09-09 VITALS — BP 136/87 | HR 98 | Temp 98.2°F | Wt 275.2 lb

## 2018-09-09 DIAGNOSIS — Z006 Encounter for examination for normal comparison and control in clinical research program: Secondary | ICD-10-CM

## 2018-09-09 NOTE — Research (Signed)
Anthony Rich came in today for his month 28 Reprieve visit. He is taking his study medication and Genvoya daily. He reports no muscle weakness, no muscle weakness and no medication changes. He will return for his annual month 48 visit on 5/6.

## 2018-09-12 ENCOUNTER — Telehealth: Payer: Self-pay | Admitting: Behavioral Health

## 2018-09-12 DIAGNOSIS — Z79899 Other long term (current) drug therapy: Secondary | ICD-10-CM

## 2018-09-12 DIAGNOSIS — B20 Human immunodeficiency virus [HIV] disease: Secondary | ICD-10-CM

## 2018-09-12 DIAGNOSIS — Z21 Asymptomatic human immunodeficiency virus [HIV] infection status: Secondary | ICD-10-CM

## 2018-09-12 MED ORDER — ELVITEG-COBIC-EMTRICIT-TENOFAF 150-150-200-10 MG PO TABS: ORAL_TABLET | ORAL | 0 refills | Status: DC

## 2018-09-12 NOTE — Telephone Encounter (Signed)
Refill request came in to refill medication.  Patient is overdue for an appointment and needs to schedule an office visit with a provider for future refills Angeline Slim RN

## 2018-10-07 ENCOUNTER — Other Ambulatory Visit: Payer: Self-pay | Admitting: Infectious Diseases

## 2018-10-07 DIAGNOSIS — Z79899 Other long term (current) drug therapy: Secondary | ICD-10-CM

## 2018-10-07 DIAGNOSIS — B20 Human immunodeficiency virus [HIV] disease: Secondary | ICD-10-CM

## 2018-10-07 DIAGNOSIS — Z21 Asymptomatic human immunodeficiency virus [HIV] infection status: Secondary | ICD-10-CM

## 2018-10-29 ENCOUNTER — Other Ambulatory Visit: Payer: Self-pay | Admitting: Infectious Diseases

## 2018-10-29 DIAGNOSIS — Z79899 Other long term (current) drug therapy: Secondary | ICD-10-CM

## 2018-10-29 DIAGNOSIS — Z21 Asymptomatic human immunodeficiency virus [HIV] infection status: Secondary | ICD-10-CM

## 2018-10-29 DIAGNOSIS — B20 Human immunodeficiency virus [HIV] disease: Secondary | ICD-10-CM

## 2018-11-06 ENCOUNTER — Encounter (INDEPENDENT_AMBULATORY_CARE_PROVIDER_SITE_OTHER): Payer: Medicaid - Out of State | Admitting: *Deleted

## 2018-11-06 ENCOUNTER — Other Ambulatory Visit: Payer: Self-pay

## 2018-11-06 VITALS — BP 125/78 | HR 87 | Temp 98.8°F | Wt 288.5 lb

## 2018-11-06 DIAGNOSIS — Z006 Encounter for examination for normal comparison and control in clinical research program: Secondary | ICD-10-CM

## 2018-11-06 MED ORDER — STUDY - REPRIEVE A5332 - PITAVASTATIN 4MG OR PLACEBO TABLET (PI-VAN DAM): 4.0000 mg | ORAL_TABLET | Freq: Every day | ORAL | 11 refills | Status: AC

## 2018-11-06 NOTE — Research (Signed)
Anthony Rich was here for his annual visit, month 48 for the Reprieve, A Randomized Trial to Prevent Vascular Events in HIV (study drug is Pitavastatin 4mg  or placebo) He denies any new problems or concerns. He said his psych doctor started him on cogentin this past week, but he doesn't know why. He denies any cardiac or covid related symptoms. He will be returning in September for the next study visit.

## 2018-12-09 ENCOUNTER — Other Ambulatory Visit: Payer: Self-pay | Admitting: Infectious Diseases

## 2018-12-09 DIAGNOSIS — Z21 Asymptomatic human immunodeficiency virus [HIV] infection status: Secondary | ICD-10-CM

## 2018-12-09 DIAGNOSIS — Z79899 Other long term (current) drug therapy: Secondary | ICD-10-CM

## 2018-12-09 DIAGNOSIS — B20 Human immunodeficiency virus [HIV] disease: Secondary | ICD-10-CM

## 2018-12-10 ENCOUNTER — Telehealth: Payer: Self-pay

## 2018-12-10 NOTE — Telephone Encounter (Signed)
Attempted to call patient to schedule an appointment with a provider in our office. Patient has not seen a provider since 08/20/17. Unable to talk to patient at this time; nor leave [place], [place]

## 2018-12-24 ENCOUNTER — Other Ambulatory Visit: Payer: Self-pay

## 2018-12-24 DIAGNOSIS — Z21 Asymptomatic human immunodeficiency virus [HIV] infection status: Secondary | ICD-10-CM

## 2018-12-24 DIAGNOSIS — B20 Human immunodeficiency virus [HIV] disease: Secondary | ICD-10-CM

## 2018-12-24 DIAGNOSIS — Z79899 Other long term (current) drug therapy: Secondary | ICD-10-CM

## 2018-12-24 MED ORDER — GENVOYA 150-150-200-10 MG PO TABS: ORAL_TABLET | ORAL | 0 refills | Status: DC

## 2018-12-24 NOTE — Telephone Encounter (Signed)
Patient call to request refill. LPN scheduled future labs/provider appointment and sent e-refill for [facility]

## 2018-12-25 ENCOUNTER — Other Ambulatory Visit: Payer: Self-pay

## 2018-12-25 ENCOUNTER — Other Ambulatory Visit: Payer: Medicaid - Out of State

## 2018-12-25 DIAGNOSIS — Z113 Encounter for screening for infections with a predominantly sexual mode of transmission: Secondary | ICD-10-CM

## 2018-12-25 DIAGNOSIS — B20 Human immunodeficiency virus [HIV] disease: Secondary | ICD-10-CM

## 2018-12-25 DIAGNOSIS — Z79899 Other long term (current) drug therapy: Secondary | ICD-10-CM

## 2019-01-09 ENCOUNTER — Ambulatory Visit: Payer: Medicaid - Out of State | Admitting: Infectious Diseases

## 2019-01-16 ENCOUNTER — Other Ambulatory Visit: Payer: Self-pay | Admitting: Infectious Diseases

## 2019-01-16 DIAGNOSIS — Z79899 Other long term (current) drug therapy: Secondary | ICD-10-CM

## 2019-01-16 DIAGNOSIS — B20 Human immunodeficiency virus [HIV] disease: Secondary | ICD-10-CM

## 2019-01-16 DIAGNOSIS — Z21 Asymptomatic human immunodeficiency virus [HIV] infection status: Secondary | ICD-10-CM

## 2019-03-11 ENCOUNTER — Ambulatory Visit (INDEPENDENT_AMBULATORY_CARE_PROVIDER_SITE_OTHER): Payer: Medicaid - Out of State | Admitting: Family

## 2019-03-11 ENCOUNTER — Encounter: Payer: Self-pay | Admitting: Family

## 2019-03-11 ENCOUNTER — Encounter (INDEPENDENT_AMBULATORY_CARE_PROVIDER_SITE_OTHER): Payer: Self-pay

## 2019-03-11 ENCOUNTER — Other Ambulatory Visit: Payer: Self-pay

## 2019-03-11 VITALS — BP 117/75 | HR 85 | Temp 98.5°F

## 2019-03-11 DIAGNOSIS — Z0000 Encounter for general adult medical examination without abnormal findings: Secondary | ICD-10-CM | POA: Diagnosis not present

## 2019-03-11 DIAGNOSIS — Z23 Encounter for immunization: Secondary | ICD-10-CM | POA: Diagnosis not present

## 2019-03-11 DIAGNOSIS — B20 Human immunodeficiency virus [HIV] disease: Secondary | ICD-10-CM

## 2019-03-11 DIAGNOSIS — Z113 Encounter for screening for infections with a predominantly sexual mode of transmission: Secondary | ICD-10-CM | POA: Diagnosis not present

## 2019-03-11 DIAGNOSIS — Z79899 Other long term (current) drug therapy: Secondary | ICD-10-CM | POA: Diagnosis not present

## 2019-03-11 DIAGNOSIS — Z006 Encounter for examination for normal comparison and control in clinical research program: Secondary | ICD-10-CM

## 2019-03-11 MED ORDER — GENVOYA 150-150-200-10 MG PO TABS: 1.0000 | ORAL_TABLET | Freq: Every day | ORAL | 5 refills | Status: DC

## 2019-03-11 NOTE — Progress Notes (Signed)
Subjective:    Patient ID: Anthony Rich, male    DOB: October 18, 1970, 48 y.o.   MRN: [id number]  No chief complaint on file.    HPI:  Anthony Rich is a 48 y.o. male with HIV disease followed by [facility] and previously on epzicom/sustiva and changed to [facility] in September 2017 was last seen in the office on 08/20/17 with good adherence to his ART regimen of Genvoya. Most recent blood work for review from October 2019 with viral load that was undetectable and CD4 count of 347.   [name] continues to take his Genvoya as prescribed no adverse side effects or missed doses since his last office visit.  Overall feeling well today with no new concerns/complaints. Denies fevers, chills, night sweats, headaches, changes in vision, neck pain/stiffness, nausea, diarrhea, vomiting, lesions or rashes.  [name] remains covered through [facility] and has no problems obtaining his medication from the pharmacy.  Denies feelings of being down, depressed, or hopeless recently.  He continues to work full-time at [employer] and [employer].  No recreational or illicit drug use, tobacco use, or alcohol consumption at present.  He remains in a monogamous relationship and is not currently sexually active.  He continues to live in [place] with his partner.   No Known Allergies    Outpatient Medications Prior to Visit  Medication Sig Dispense Refill  . albuterol (PROVENTIL HFA;VENTOLIN HFA) 108 (90 BASE) MCG/ACT inhaler Inhale 2 puffs into the lungs every 6 (six) hours as needed. wheezing 1 Inhaler 12  . amLODipine (NORVASC) 5 MG tablet Take 5 mg by mouth daily.    . budesonide-formoterol (SYMBICORT) 160-4.5 MCG/ACT inhaler Inhale 2 puffs into the lungs 2 (two) times daily. 1 Inhaler 12  . hydrOXYzine (ATARAX/VISTARIL) 10 MG tablet Take 1 tablet (10 mg total) by mouth every 8 (eight) hours as needed for itching. 90 tablet 1  . lithium 600 MG capsule Take 1 capsule (600 mg total) by mouth 2 (two) times daily  with a meal.    . risperiDONE (RISPERDAL) 2 MG tablet Take 1 tablet (2 mg total) by mouth daily.    Sarita Bottom - REPRIEVE [phone] - pitavastatin 4 mg or placebo tablet ([place]) Take 1 tablet (4 mg total) by mouth daily. 90 tablet 11  . GENVOYA 150-150-200-10 MG TABS tablet TAKE 1 TABLET BY MOUTH EVERY DAY WITH BREAKFAST 30 tablet 2   Facility-Administered Medications Prior to Visit  Medication Dose Route Frequency Provider Last Rate Last Dose  . cromolyn (OPTICROM) 4 % ophthalmic solution 2 drop  2 drop Both Eyes QID Ginnie Smart, MD         Past Medical History:  Diagnosis Date  . Asthma      History reviewed. No pertinent surgical history.     Review of Systems  Constitutional: Negative for appetite change, chills, fatigue, fever and unexpected weight change.  Eyes: Negative for visual disturbance.  Respiratory: Negative for cough, chest tightness, shortness of breath and wheezing.   Cardiovascular: Negative for chest pain and leg swelling.  Gastrointestinal: Negative for abdominal pain, constipation, diarrhea, nausea and vomiting.  Genitourinary: Negative for dysuria, flank pain, frequency, genital sores, hematuria and urgency.  Skin: Negative for rash.  Allergic/Immunologic: Negative for immunocompromised state.  Neurological: Negative for dizziness and headaches.      Objective:    BP 117/75   Pulse 85   Temp 98.5 F (36.9 C) (Oral)  Nursing note and vital signs reviewed.  Physical  Exam Constitutional:      General: He is not in acute distress.    Appearance: He is well-developed.  Eyes:     Conjunctiva/sclera: Conjunctivae normal.  Neck:     Musculoskeletal: Neck supple.  Cardiovascular:     Rate and Rhythm: Normal rate and regular rhythm.     Heart sounds: Normal heart sounds. No murmur. No friction rub. No gallop.   Pulmonary:     Effort: Pulmonary effort is normal. No respiratory distress.     Breath sounds: Normal breath sounds. No wheezing or  rales.  Chest:     Chest wall: No tenderness.  Abdominal:     General: Bowel sounds are normal.     Palpations: Abdomen is soft.     Tenderness: There is no abdominal tenderness.  Lymphadenopathy:     Cervical: No cervical adenopathy.  Skin:    General: Skin is warm and dry.     Findings: No rash.  Neurological:     Mental Status: He is alert and oriented to person, place, and time.  Psychiatric:        Behavior: Behavior normal.        Thought Content: Thought content normal.        Judgment: Judgment normal.      [facility] 03/11/2019 08/20/2017 02/07/2017 03/13/2016 12/01/2013  Decreased Interest 0 0 0 0 0  Down, Depressed, Hopeless 0 0 0 0 0  PHQ - 2 Score 0 0 0 0 0  Altered sleeping - - - - -  Tired, decreased energy - - - - -  Change in appetite - - - - -  Feeling bad or failure about yourself  - - - - -  Trouble concentrating - - - - -  Moving slowly or fidgety/restless - - - - -  Suicidal thoughts - - - - -  PHQ-9 Score - - - - -       Assessment & Plan:    Patient Active Problem List   Diagnosis Date Noted  . Human immunodeficiency virus (HIV) disease (HCC) 02/20/2003    Priority: High  . Healthcare maintenance 03/11/2019  . Obesity, Class III, BMI 40-49.9 (morbid obesity) (HCC) 08/20/2017  . Hepatitis B immune 02/07/2017  . Excessive tearing 02/07/2017  . Schizophrenia (HCC) 02/07/2017  . Depression 05/26/2013  . Screening examination for venereal disease 08/08/2012  . Long term (current) use of bisphosphonates 08/08/2012  . DENTAL CARIES 08/11/2008  . XERODERMA 08/11/2008  . RECTAL BLEEDING 10/28/2007  . Asthma 04/12/2006     Problem List Items Addressed This Visit      Other   Human immunodeficiency virus (HIV) disease (HCC) - Primary    [name] appears to have well-controlled HIV disease with good adherence and tolerance to his ART regimen of Genvoya.  He has no signs/symptoms of opportunistic infection or progressive HIV disease.   There is a medication concern that needs to be continually monitored with his Symbicort and Genvoya.  He is asymptomatic at this time and will continue to monitor but [facility] need to [facility] to avoid interaction with cobicistat and corticosteroids.  Check blood work today.  Continue current dose of Genvoya.  Plan for follow-up in 6 months or sooner if needed with lab work 1 to 2 weeks prior to appointment or on the same day.      Relevant Medications   elvitegravir-cobicistat-emtricitabine-tenofovir (GENVOYA) 150-150-200-10 MG TABS tablet   Other Relevant  Orders   COMPLETE METABOLIC PANEL WITH GFR   CBC   T-helper cell (CD4)- ([facility] clinic only)   HIV-1 RNA quant-no reflex-bld   Healthcare maintenance     Influenza updated today.  Will be due for Prevnar and second dose of Menveo at next office visit.  Discussed importance of safe sexual practice to reduce risk of acquisition/transmission of STI.  Declines condoms.  Working on finding a Pharmacist, community in [place] for routine dental screening.       Other Visit Diagnoses    Need for immunization against influenza       Relevant Orders   Flu Vaccine QUAD 36+ mos IM (Completed)   Encounter for long-term (current) use of medications       Relevant Medications   elvitegravir-cobicistat-emtricitabine-tenofovir (GENVOYA) 150-150-200-10 MG TABS tablet   Screening for STDs (sexually transmitted diseases)       Relevant Orders   RPR       I have changed Lorenza Burton Jr's Genvoya. I am also having him maintain his budesonide-formoterol, albuterol, amLODipine, risperiDONE, lithium, hydrOXYzine, and REPRIEVE A5332 pitavastatin or placebo. We will continue to administer cromolyn.   Meds ordered this encounter  Medications  . elvitegravir-cobicistat-emtricitabine-tenofovir (GENVOYA) 150-150-200-10 MG TABS tablet    Sig: Take 1 tablet by mouth daily with breakfast.    Dispense:  30 tablet    Refill:  5    Order Specific  Question:   Supervising Provider    Answer:   Carlyle Basques [[id number]]     Follow-up: Return in about 6 months (around 09/08/2019), or if symptoms worsen or fail to improve.   Terri Piedra, MSN, FNP-C Nurse Practitioner [facility] for Infectious Disease [place] number: [phone]

## 2019-03-11 NOTE — Assessment & Plan Note (Signed)
[name] appears to have well-controlled HIV disease with good adherence and tolerance to his ART regimen of Genvoya.  He has no signs/symptoms of opportunistic infection or progressive HIV disease.  There is a medication concern that needs to be continually monitored with his Symbicort and Genvoya.  He is asymptomatic at this time and will continue to monitor but may need to change to [facility] in the future to avoid interaction with cobicistat and corticosteroids.  Check blood work today.  Continue current dose of Genvoya.  Plan for follow-up in 6 months or sooner if needed with lab work 1 to 2 weeks prior to appointment or on the same day.

## 2019-03-11 NOTE — Patient Instructions (Signed)
Nice to see you.  We will check your blood work today.  Continue to take your Genvoya as prescribed daily.   If your inhalers change please let us know as we may need to adjust your medications.  Refills of medication have been sent to the pharmacy.  Plan for follow up in 6 months or sooner if needed.   Have a great day and stay safe!

## 2019-03-11 NOTE — Assessment & Plan Note (Signed)
   Influenza updated today.  Will be due for Prevnar and second dose of Menveo at next office visit.  Discussed importance of safe sexual practice to reduce risk of acquisition/transmission of STI.  Declines condoms.  Working on finding a Pharmacist, community in [place] for routine dental screening.

## 2019-03-11 NOTE — Research (Signed)
Patient here for Reprieve study visit. He voices no new complaints or concerns and states he has had excellent adherence with his study medication as well as the [place]. It was noted he has not had clinic follow up nor labs in over a year. Consulted with clinic staff and he is attending a same day visit with Marya Amsler, NP as well as the laboratory. His next study visit is scheduled in January 2021.

## 2019-03-12 LAB — T-HELPER CELL (CD4) - (RCID CLINIC ONLY)
CD4 % Helper T Cell: 33 % (ref 33–65)
CD4 T Cell Abs: 480 /uL (ref 400–1790)

## 2019-03-13 ENCOUNTER — Telehealth: Payer: Self-pay

## 2019-03-13 LAB — COMPLETE METABOLIC PANEL WITH GFR
AG Ratio: 1.2 (calc) (ref 1.0–2.5)
ALT: 33 U/L (ref 9–46)
AST: 22 U/L (ref 10–40)
Albumin: 3.8 g/dL (ref 3.6–5.1)
Alkaline phosphatase (APISO): 76 U/L (ref 36–130)
BUN: 11 mg/dL (ref 7–25)
CO2: 33 mmol/L — ABNORMAL HIGH (ref 20–32)
Calcium: 9.3 mg/dL (ref 8.6–10.3)
Chloride: 105 mmol/L (ref 98–110)
Creat: 1.22 mg/dL (ref 0.60–1.35)
GFR, Est African American: 82 mL/min/{1.73_m2} (ref >=60)
GFR, Est Non African American: 71 mL/min/{1.73_m2} (ref >=60)
Globulin: 3.2 g/dL (calc) (ref 1.9–3.7)
Glucose, Bld: 96 mg/dL (ref 65–99)
Potassium: 3.6 mmol/L (ref 3.5–5.3)
Sodium: 146 mmol/L (ref 135–146)
Total Bilirubin: 0.3 mg/dL (ref 0.2–1.2)
Total Protein: 7 g/dL (ref 6.1–8.1)

## 2019-03-13 LAB — CBC
HCT: 36.5 % — ABNORMAL LOW (ref 38.5–50.0)
Hemoglobin: 11.6 g/dL — ABNORMAL LOW (ref 13.2–17.1)
MCH: 25.3 pg — ABNORMAL LOW (ref 27.0–33.0)
MCHC: 31.8 g/dL — ABNORMAL LOW (ref 32.0–36.0)
MCV: 79.5 fL — ABNORMAL LOW (ref 80.0–100.0)
MPV: 10 fL (ref 7.5–12.5)
Platelets: 374 10*3/uL (ref 140–400)
RBC: 4.59 10*6/uL (ref 4.20–5.80)
RDW: 16.2 % — ABNORMAL HIGH (ref 11.0–15.0)
WBC: 10.7 10*3/uL (ref 3.8–10.8)

## 2019-03-13 LAB — HIV-1 RNA QUANT-NO REFLEX-BLD
HIV 1 RNA Quant: <20 copies/mL
HIV-1 RNA Quant, Log: <1.3 Log copies/mL

## 2019-03-13 LAB — RPR: RPR Ser Ql: NONREACTIVE

## 2019-03-13 NOTE — Telephone Encounter (Signed)
-----   Message from [place], [place] sent at 03/13/2019  8:39 AM EDT ----- Please inform [name] that his viral load is undetectable and CD4 count is 480, both of which are good. His kidney function, liver function and electrolytes are all normal. Continue to take Genvoya as prescribed and follow up in 6 months or sooner if needed.

## 2019-03-13 NOTE — Telephone Encounter (Signed)
Called patient with results per Terri Piedra, FNP. Patient was verified by two identifiers before relaying results. Patient did not have any questions about labs. [place]

## 2019-07-15 ENCOUNTER — Encounter: Payer: Medicaid - Out of State | Admitting: *Deleted

## 2019-07-18 ENCOUNTER — Other Ambulatory Visit: Payer: Self-pay

## 2019-07-18 ENCOUNTER — Encounter (INDEPENDENT_AMBULATORY_CARE_PROVIDER_SITE_OTHER): Payer: Medicaid - Out of State | Admitting: *Deleted

## 2019-07-18 VITALS — BP 131/80 | HR 87 | Temp 98.3°F | Wt 292.0 lb

## 2019-07-18 DIAGNOSIS — Z006 Encounter for examination for normal comparison and control in clinical research program: Secondary | ICD-10-CM

## 2019-07-18 NOTE — Research (Signed)
Anthony Rich was here today for his month 56 visit for Reprieve, A Randomized Trial to Prevent Vascular Events in HIV (study drug is Pitavastatin 4mg  or placebo)  He denies any new problems or concerns but when queried further he does report a lot itching on his genitals, no sores or rashes which has been a chronic problem for him. He has tried various creams over the years and has recently used his partner's mupiricin ointment and says that seems to work and is asking fro a prescription. I told him I would let Dr. know about that. He will be returning in March for both a April study visit and to see Dr. [id number] that day.

## 2019-09-09 ENCOUNTER — Other Ambulatory Visit: Payer: Self-pay

## 2019-09-09 ENCOUNTER — Ambulatory Visit (INDEPENDENT_AMBULATORY_CARE_PROVIDER_SITE_OTHER): Payer: Self-pay | Admitting: Infectious Diseases

## 2019-09-09 ENCOUNTER — Encounter: Payer: Self-pay | Admitting: Infectious Diseases

## 2019-09-09 ENCOUNTER — Encounter (INDEPENDENT_AMBULATORY_CARE_PROVIDER_SITE_OTHER): Payer: Self-pay | Admitting: *Deleted

## 2019-09-09 VITALS — BP 148/90 | HR 67 | Temp 97.6°F | Ht 65.5 in | Wt 286.2 lb

## 2019-09-09 VITALS — Ht 64.5 in | Wt 284.0 lb

## 2019-09-09 DIAGNOSIS — Z006 Encounter for examination for normal comparison and control in clinical research program: Secondary | ICD-10-CM

## 2019-09-09 DIAGNOSIS — Z23 Encounter for immunization: Secondary | ICD-10-CM

## 2019-09-09 DIAGNOSIS — B20 Human immunodeficiency virus [HIV] disease: Secondary | ICD-10-CM

## 2019-09-09 DIAGNOSIS — F203 Undifferentiated schizophrenia: Secondary | ICD-10-CM

## 2019-09-09 MED ORDER — HYDROCHLOROTHIAZIDE 25 MG PO TABS: 25.0000 mg | ORAL_TABLET | Freq: Every morning | ORAL | 3 refills | Status: AC

## 2019-09-09 NOTE — Research (Signed)
Anthony Rich was here for his week 336 visit for The HAILO Study: A Long Term follow-up of Older HIV-Infected Adults in the [facility], an observational study addressing the issues of aging, HIV infection and Inflammation. He denies any new medications but has been diagnosed with sleep apnea and is currently not using his CPAP. He denies any oother ne wproblems. He is supposed to see dr. Ninetta Lights this afternoon. He will be returning in May for the Reprieve study and again in October for the final visit for this study.

## 2019-09-09 NOTE — Assessment & Plan Note (Addendum)
Doing well Await his labs from [facility] Partner is positive (on genvoya)  Given condoms PCV 13 today.  enouraged to get COVID vax rtc in 9 months.

## 2019-09-09 NOTE — Assessment & Plan Note (Signed)
Has lost some wt, encouraged him to continue wt loss.

## 2019-09-09 NOTE — Progress Notes (Signed)
   Subjective:    Patient ID: Anthony Rich, male    DOB: 1971-03-19, 49 y.o.   MRN: [id number]  HPI 49yo M with HIV+, has been followed at [facility], previouslyonepzicom/sustiva. Changed to genvoya 03-2016.  Had blood work done this AM at [facility].  No missed ART.  He was involuntarily hospitalized (2018) dx with schizophrenia and schizoaffective bipolar d/o. Is on lithium, feels well.  Has HIV+ partner.   HIV 1 RNA Quant  Date Value  03/11/2019 <20 NOT DETECTED copies/mL  03/13/2016 6,578 copies/mL (H)  02/08/2012 <40   HIV-1 RNA Viral Load (no units)  Date Value  04/11/2018 <40  12/12/2017 <40  08/20/2017 <40   CD4 (no units)  Date Value  02/21/2016 384  09/18/2014 508  07/28/2013 606   CD4 T Cell Abs (/uL)  Date Value  03/11/2019 480  12/18/2016 300 (L)  03/13/2016 290 (L)    Review of Systems  Constitutional: Negative for appetite change and unexpected weight change.  Respiratory: Negative for cough and shortness of breath.   Cardiovascular: Negative for chest pain.  Gastrointestinal: Negative for constipation and diarrhea.  Genitourinary: Negative for difficulty urinating.  Neurological: Negative for headaches.  Psychiatric/Behavioral: Negative for dysphoric mood and sleep disturbance.       Objective:   Physical Exam Constitutional:      Appearance: Normal appearance. He is obese.  HENT:     Mouth/Throat:     Mouth: Mucous membranes are moist.     Pharynx: No oropharyngeal exudate.  Eyes:     Extraocular Movements: Extraocular movements intact.     Pupils: Pupils are equal, round, and reactive to light.  Cardiovascular:     Rate and Rhythm: Normal rate and regular rhythm.  Pulmonary:     Effort: Pulmonary effort is normal.     Breath sounds: Normal breath sounds.  Abdominal:     General: Bowel sounds are normal. There is no distension.     Palpations: Abdomen is soft.     Tenderness: There is no abdominal tenderness.  Musculoskeletal:   Cervical back: Normal range of motion and neck supple.     Right lower leg: Edema present.     Left lower leg: Edema present.  Neurological:     General: No focal deficit present.     Mental Status: He is alert.  Psychiatric:        Mood and Affect: Mood normal.           Assessment & Plan:

## 2019-09-09 NOTE — Assessment & Plan Note (Signed)
Will continue lithium via his psychiatrist.

## 2019-09-10 LAB — PROTEIN / CREATININE RATIO, URINE
Creatinine, Urine: 146 mg/dL (ref 20–320)
Protein/Creat Ratio: 151 mg/g creat — ABNORMAL HIGH (ref 22–128)
Protein/Creatinine Ratio: 0.151 mg/mg creat — ABNORMAL HIGH (ref 0.022–0.12)
Total Protein, Urine: 22 mg/dL (ref 5–25)

## 2019-09-10 LAB — COMPREHENSIVE METABOLIC PANEL
AG Ratio: 1.3 (calc) (ref 1.0–2.5)
ALT: 28 U/L (ref 9–46)
AST: 18 U/L (ref 10–40)
Albumin: 4 g/dL (ref 3.6–5.1)
Alkaline phosphatase (APISO): 87 U/L (ref 36–130)
BUN: 11 mg/dL (ref 7–25)
CO2: 30 mmol/L (ref 20–32)
Calcium: 9.4 mg/dL (ref 8.6–10.3)
Chloride: 103 mmol/L (ref 98–110)
Creat: 0.94 mg/dL (ref 0.60–1.35)
Globulin: 3.1 g/dL (calc) (ref 1.9–3.7)
Glucose, Bld: 107 mg/dL — ABNORMAL HIGH (ref 65–99)
Potassium: 3.8 mmol/L (ref 3.5–5.3)
Sodium: 140 mmol/L (ref 135–146)
Total Bilirubin: 0.4 mg/dL (ref 0.2–1.2)
Total Protein: 7.1 g/dL (ref 6.1–8.1)

## 2019-09-10 LAB — LIPID PANEL
Cholesterol: 169 mg/dL (ref <200)
HDL: 32 mg/dL — ABNORMAL LOW (ref >=40)
LDL Cholesterol (Calc): 112 mg/dL (calc) — ABNORMAL HIGH
Non-HDL Cholesterol (Calc): 137 mg/dL (calc) — ABNORMAL HIGH (ref <130)
Total CHOL/HDL Ratio: 5.3 (calc) — ABNORMAL HIGH (ref <5.0)
Triglycerides: 132 mg/dL (ref <150)

## 2019-09-10 LAB — HEMOGLOBIN A1C
Hgb A1c MFr Bld: 6.2 % of total Hgb — ABNORMAL HIGH (ref <5.7)
Mean Plasma Glucose: 131 (calc)
eAG (mmol/L): 7.3 (calc)

## 2019-09-30 ENCOUNTER — Encounter: Payer: Self-pay | Admitting: Infectious Disease

## 2019-09-30 LAB — CD4/CD8 (T-HELPER/T-SUPPRESSOR CELL)
CD4 % Helper T Cell: 37.7
CD4 Count: 566
CD8 % Suppressor T Cell: 20.3
CD8 T Cell Abs: 305
HIV 1 RNA UltraQuant: <40

## 2019-10-09 NOTE — Addendum Note (Signed)
Addended by: Andree Coss on: 10/09/2019 12:53 PM   Modules accepted: Orders

## 2019-11-10 ENCOUNTER — Encounter: Payer: Medicaid - Out of State | Admitting: *Deleted

## 2019-11-28 ENCOUNTER — Encounter (INDEPENDENT_AMBULATORY_CARE_PROVIDER_SITE_OTHER): Payer: Self-pay | Admitting: *Deleted

## 2019-11-28 ENCOUNTER — Other Ambulatory Visit: Payer: Self-pay

## 2019-11-28 VITALS — BP 128/78 | HR 93 | Temp 98.3°F | Wt 292.4 lb

## 2019-11-28 DIAGNOSIS — Z006 Encounter for examination for normal comparison and control in clinical research program: Secondary | ICD-10-CM

## 2019-11-28 NOTE — Research (Signed)
Anthony Rich was here for his month 60 visit for Reprieve, A Randomized Trial to Prevent Vascular Events in HIV (study drug is Pitavastatin 4mg  or placebo)  He denies any new problems or medications. He says his adherence is good. He is reluctant to get vaccinated for Covid, so we discussed the pros and cons of getting the vaccine. I did empathize his risk due to obesity and asthma if he were to get sick with covid 19. We also discussed healthy eating behaviors and encouraged him to lose weight. He said he would try at least. He is due to return in October for both Reprieve and the A5322 HAILO study.

## 2020-03-03 ENCOUNTER — Encounter

## 2020-03-04 ENCOUNTER — Encounter: Attending: Gastroenterology | Primary: Family

## 2020-04-02 ENCOUNTER — Encounter: Payer: Self-pay | Admitting: Infectious Diseases

## 2020-04-02 ENCOUNTER — Encounter (INDEPENDENT_AMBULATORY_CARE_PROVIDER_SITE_OTHER): Payer: Self-pay | Admitting: *Deleted

## 2020-04-02 ENCOUNTER — Other Ambulatory Visit: Payer: Self-pay

## 2020-04-02 VITALS — BP 132/82 | HR 79 | Temp 97.9°F | Wt 287.1 lb

## 2020-04-02 DIAGNOSIS — Z006 Encounter for examination for normal comparison and control in clinical research program: Secondary | ICD-10-CM

## 2020-04-02 NOTE — Research (Signed)
Anthony Rich was here for both the Reprieve study and his final visit for The HAILO Study: A Long Term follow-up of Older HIV-Infected Adults in the [facility], an observational study addressing the issues of aging, HIV infection and Inflammation.  He denies any new problems or medications. He did finally get his Pfizer covid vaccines in July and will probably get his flu vaccine in December when he comes for the next visit.

## 2020-04-05 LAB — COMPREHENSIVE METABOLIC PANEL
AG Ratio: 1.3 (calc) (ref 1.0–2.5)
ALT: 32 U/L (ref 9–46)
AST: 21 U/L (ref 10–40)
Albumin: 3.9 g/dL (ref 3.6–5.1)
Alkaline phosphatase (APISO): 90 U/L (ref 36–130)
BUN: 7 mg/dL (ref 7–25)
CO2: 30 mmol/L (ref 20–32)
Calcium: 8.7 mg/dL (ref 8.6–10.3)
Chloride: 105 mmol/L (ref 98–110)
Creat: 1.01 mg/dL (ref 0.60–1.35)
Globulin: 3 g/dL (calc) (ref 1.9–3.7)
Glucose, Bld: 138 mg/dL — ABNORMAL HIGH (ref 65–99)
Potassium: 3.7 mmol/L (ref 3.5–5.3)
Sodium: 143 mmol/L (ref 135–146)
Total Bilirubin: 0.5 mg/dL (ref 0.2–1.2)
Total Protein: 6.9 g/dL (ref 6.1–8.1)

## 2020-04-05 LAB — LIPID PANEL
Cholesterol: 158 mg/dL (ref <200)
HDL: 36 mg/dL — ABNORMAL LOW (ref >=40)
LDL Cholesterol (Calc): 103 mg/dL (calc) — ABNORMAL HIGH
Non-HDL Cholesterol (Calc): 122 mg/dL (calc) (ref <130)
Total CHOL/HDL Ratio: 4.4 (calc) (ref <5.0)
Triglycerides: 95 mg/dL (ref <150)

## 2020-04-05 LAB — PROTEIN / CREATININE RATIO, URINE
Creatinine, Urine: 75 mg/dL (ref 20–320)
Protein/Creat Ratio: 133 mg/g creat — ABNORMAL HIGH (ref 22–128)
Protein/Creatinine Ratio: 0.133 mg/mg creat — ABNORMAL HIGH (ref 0.022–0.12)
Total Protein, Urine: 10 mg/dL (ref 5–25)

## 2020-04-05 LAB — HEPATITIS C ANTIBODY
Hepatitis C Ab: NONREACTIVE
SIGNAL TO CUT-OFF: 0.04 (ref <1.00)

## 2020-06-10 ENCOUNTER — Other Ambulatory Visit: Payer: Self-pay | Admitting: Family

## 2020-06-10 ENCOUNTER — Ambulatory Visit: Payer: Medicaid - Out of State | Admitting: Infectious Diseases

## 2020-06-10 ENCOUNTER — Encounter: Payer: Medicaid - Out of State | Admitting: *Deleted

## 2020-06-10 DIAGNOSIS — B20 Human immunodeficiency virus [HIV] disease: Secondary | ICD-10-CM

## 2020-06-10 DIAGNOSIS — Z79899 Other long term (current) drug therapy: Secondary | ICD-10-CM

## 2020-06-17 ENCOUNTER — Ambulatory Visit: Payer: Medicaid - Out of State | Admitting: Infectious Diseases

## 2020-06-17 ENCOUNTER — Encounter: Payer: Medicaid - Out of State | Admitting: *Deleted

## 2020-06-24 ENCOUNTER — Other Ambulatory Visit (HOSPITAL_COMMUNITY)
Admission: RE | Admit: 2020-06-24 | Discharge: 2020-06-24 | Disposition: A | Discharge status: Home / Self Care | Payer: BLUE CROSS/BLUE SHIELD | Source: Ambulatory Visit | Attending: Infectious Diseases | Admitting: Infectious Diseases

## 2020-06-24 ENCOUNTER — Encounter: Payer: Self-pay | Admitting: *Deleted

## 2020-06-24 ENCOUNTER — Ambulatory Visit (INDEPENDENT_AMBULATORY_CARE_PROVIDER_SITE_OTHER): Payer: Self-pay | Admitting: Infectious Diseases

## 2020-06-24 ENCOUNTER — Other Ambulatory Visit: Payer: Medicaid Other

## 2020-06-24 ENCOUNTER — Other Ambulatory Visit: Payer: Self-pay

## 2020-06-24 ENCOUNTER — Encounter: Payer: Self-pay | Admitting: Infectious Diseases

## 2020-06-24 VITALS — BP 156/75 | HR 93 | Temp 98.0°F | Wt 293.0 lb

## 2020-06-24 DIAGNOSIS — B20 Human immunodeficiency virus [HIV] disease: Secondary | ICD-10-CM

## 2020-06-24 DIAGNOSIS — Z23 Encounter for immunization: Secondary | ICD-10-CM

## 2020-06-24 DIAGNOSIS — Z006 Encounter for examination for normal comparison and control in clinical research program: Secondary | ICD-10-CM

## 2020-06-24 DIAGNOSIS — Z113 Encounter for screening for infections with a predominantly sexual mode of transmission: Secondary | ICD-10-CM | POA: Diagnosis not present

## 2020-06-24 DIAGNOSIS — Z79899 Other long term (current) drug therapy: Secondary | ICD-10-CM

## 2020-06-24 DIAGNOSIS — H04203 Unspecified epiphora, bilateral lacrimal glands: Secondary | ICD-10-CM

## 2020-06-24 DIAGNOSIS — F203 Undifferentiated schizophrenia: Secondary | ICD-10-CM

## 2020-06-24 DIAGNOSIS — J4520 Mild intermittent asthma, uncomplicated: Secondary | ICD-10-CM

## 2020-06-24 LAB — T-HELPER CELL (CD4) - (RCID CLINIC ONLY)
CD4 % Helper T Cell: 35 % (ref 33–65)
CD4 T Cell Abs: 589 /uL (ref 400–1790)

## 2020-06-24 MED ORDER — BUDESONIDE-FORMOTEROL FUMARATE 160-4.5 MCG/ACT IN AERO: 2.0000 | INHALATION_SPRAY | Freq: Two times a day (BID) | RESPIRATORY_TRACT | 12 refills | Status: DC

## 2020-06-24 MED ORDER — ALBUTEROL SULFATE HFA 108 (90 BASE) MCG/ACT IN AERS: 2.0000 | INHALATION_SPRAY | Freq: Four times a day (QID) | RESPIRATORY_TRACT | 12 refills | Status: AC | PRN

## 2020-06-24 NOTE — Assessment & Plan Note (Signed)
rec that he use normal saline eye drops.

## 2020-06-24 NOTE — Assessment & Plan Note (Signed)
Mood stable, continue pcp mental health  f/u.

## 2020-06-24 NOTE — Assessment & Plan Note (Signed)
Will give him PCV 23 and flu vax today.  Needs COVID booster schedule Partner HIV+, in care.  Appreciate [facility] f/u.  Will see him back in 9 months.

## 2020-06-24 NOTE — Research (Signed)
Anthony Rich was here for month 68 for Reprieve, A Randomized Trial to Prevent Vascular Events in HIV (study drug is Pitavastatin 4mg  or placebo).  He denies any current problems or new medications at this time. He says his adherence is very good with his study med.  He will be seeing Dr. this afternoon and returning for research in May.

## 2020-06-24 NOTE — Assessment & Plan Note (Addendum)
He is planning to cut back on fast food.  Sets goal to lose 40#.  Exercise as tolerated.

## 2020-06-24 NOTE — Progress Notes (Signed)
   Subjective:    Patient ID: Anthony Rich, male    DOB: 02/03/1971, 49 y.o.   MRN: [id number]  HPI 49yo M with HIV+, has been followed at [facility], previouslyonepzicom/sustiva. Changed to genvoya 03-2016. He denies issues with his ART.  Had blood work done this AM at [facility], awaiting result.  He was involuntarily hospitalized (2018) dx with schizophrenia and schizoaffective bipolar d/o. Is on lithium, feeling well.  Denies missed amlodipine.  Has HIV+ partner who is here with him.   HIV 1 RNA Quant  Date Value  03/11/2019 <20 NOT DETECTED copies/mL  03/13/2016 6,578 copies/mL (H)  02/08/2012 <40   HIV-1 RNA Viral Load (no units)  Date Value  04/11/2018 <40  12/12/2017 <40  08/20/2017 <40   CD4 (no units)  Date Value  02/21/2016 384  09/18/2014 508  07/28/2013 606   CD4 T Cell Abs (/uL)  Date Value  06/24/2020 589  03/11/2019 480  12/18/2016 300 (L)    Needs COVID booster but has gotten other vax. No problems with vaccine.  Review of Systems  Constitutional: Negative for appetite change and unexpected weight change.  Respiratory: Negative for cough and shortness of breath.   Cardiovascular: Positive for leg swelling.  Gastrointestinal: Negative for constipation and diarrhea.  Genitourinary: Negative for difficulty urinating.  Psychiatric/Behavioral: Negative for sleep disturbance. The patient is not nervous/anxious.        Objective:   Physical Exam Vitals reviewed.  Constitutional:      Appearance: Normal appearance.  HENT:     Mouth/Throat:     Mouth: Mucous membranes are dry.  Eyes:     General:        Right eye: Discharge present.        Left eye: Discharge present.    Extraocular Movements: Extraocular movements intact.     Pupils: Pupils are equal, round, and reactive to light.  Cardiovascular:     Rate and Rhythm: Normal rate and regular rhythm.  Pulmonary:     Effort: Pulmonary effort is normal.     Breath sounds: Normal breath sounds.   Abdominal:     General: Bowel sounds are normal. There is no distension.     Palpations: Abdomen is soft.     Tenderness: There is no abdominal tenderness.  Musculoskeletal:     Cervical back: Normal range of motion.     Right lower leg: Edema present.     Left lower leg: Edema present.  Lymphadenopathy:     Cervical: No cervical adenopathy.  Neurological:     General: No focal deficit present.     Mental Status: He is alert.  Psychiatric:        Mood and Affect: Mood normal.           Assessment & Plan:

## 2020-06-24 NOTE — Addendum Note (Signed)
Addended by: Lorenso Courier on: 06/24/2020 02:56 PM   Modules accepted: Orders

## 2020-06-24 NOTE — Assessment & Plan Note (Addendum)
He denies SOB or cough.  Prn inhaler use.  D/c flovent, keep symbicort.  Will refill his inh as needed.

## 2020-06-28 LAB — URINE CYTOLOGY ANCILLARY ONLY
Chlamydia: NEGATIVE
Comment: NEGATIVE
Comment: NORMAL
Neisseria Gonorrhea: NEGATIVE

## 2020-07-06 ENCOUNTER — Other Ambulatory Visit: Payer: Self-pay | Admitting: Infectious Diseases

## 2020-07-06 DIAGNOSIS — J4520 Mild intermittent asthma, uncomplicated: Secondary | ICD-10-CM

## 2020-07-06 LAB — COMPREHENSIVE METABOLIC PANEL
AG Ratio: 1.7 (calc) (ref 1.0–2.5)
ALT: 32 U/L (ref 9–46)
AST: 25 U/L (ref 10–40)
Albumin: 4.1 g/dL (ref 3.6–5.1)
Alkaline phosphatase (APISO): 94 U/L (ref 36–130)
BUN: 9 mg/dL (ref 7–25)
CO2: 31 mmol/L (ref 20–32)
Calcium: 9 mg/dL (ref 8.6–10.3)
Chloride: 102 mmol/L (ref 98–110)
Creat: 0.93 mg/dL (ref 0.60–1.35)
Globulin: 2.4 g/dL (calc) (ref 1.9–3.7)
Glucose, Bld: 166 mg/dL — ABNORMAL HIGH (ref 65–99)
Potassium: 3.5 mmol/L (ref 3.5–5.3)
Sodium: 142 mmol/L (ref 135–146)
Total Bilirubin: 0.2 mg/dL (ref 0.2–1.2)
Total Protein: 6.5 g/dL (ref 6.1–8.1)

## 2020-07-06 LAB — CBC WITH DIFFERENTIAL/PLATELET
Absolute Monocytes: 727 cells/uL (ref 200–950)
Basophils Absolute: 58 cells/uL (ref 0–200)
Basophils Relative: 0.8 %
Eosinophils Absolute: 367 cells/uL (ref 15–500)
Eosinophils Relative: 5.1 %
HCT: 35.4 % — ABNORMAL LOW (ref 38.5–50.0)
Hemoglobin: 11.1 g/dL — ABNORMAL LOW (ref 13.2–17.1)
Lymphs Abs: 1620 cells/uL (ref 850–3900)
MCH: 24.5 pg — ABNORMAL LOW (ref 27.0–33.0)
MCHC: 31.4 g/dL — ABNORMAL LOW (ref 32.0–36.0)
MCV: 78.1 fL — ABNORMAL LOW (ref 80.0–100.0)
MPV: 9.6 fL (ref 7.5–12.5)
Monocytes Relative: 10.1 %
Neutro Abs: 4428 cells/uL (ref 1500–7800)
Neutrophils Relative %: 61.5 %
Platelets: 354 10*3/uL (ref 140–400)
RBC: 4.53 10*6/uL (ref 4.20–5.80)
RDW: 15.2 % — ABNORMAL HIGH (ref 11.0–15.0)
Total Lymphocyte: 22.5 %
WBC: 7.2 10*3/uL (ref 3.8–10.8)

## 2020-07-06 LAB — HIV-1 RNA QUANT-NO REFLEX-BLD
HIV 1 RNA Quant: <20 Copies/mL
HIV-1 RNA Quant, Log: <1.3 Log cps/mL

## 2020-07-06 LAB — RPR: RPR Ser Ql: NONREACTIVE

## 2020-07-06 MED ORDER — FLUTICASONE-SALMETEROL 100-50 MCG/DOSE IN AEPB: 1.0000 | INHALATION_SPRAY | Freq: Every day | RESPIRATORY_TRACT | 3 refills | Status: DC

## 2020-07-23 ENCOUNTER — Encounter: Payer: Self-pay | Admitting: Infectious Disease

## 2020-07-23 LAB — CD4/CD8 (T-HELPER/T-SUPPRESSOR CELL)
CD4 % Helper T Cell: 53.4
CD4 Count: 801
CD8 % Suppressor T Cell: 23.1
CD8 T Cell Abs: 347
HIV 1 RNA UltraQuant: 102

## 2020-07-28 ENCOUNTER — Encounter: Payer: Self-pay | Admitting: Infectious Disease

## 2020-07-28 LAB — CD4/CD8 (T-HELPER/T-SUPPRESSOR CELL)
CD4 % Helper T Cell: 37.7
CD4 Count: 566
CD8 % Suppressor T Cell: 20.3
CD8 T Cell Abs: 305
HIV 1 RNA UltraQuant: <40

## 2020-07-29 ENCOUNTER — Telehealth

## 2020-07-29 NOTE — Telephone Encounter (Signed)
I called patient, we discussed scheduling colonoscopy, reviewed questionaire, he is ok to schedule.  He chose 08-20-2020 . No PA required per [facility] care, age limit is 45-to 52.  Miralax pended to Dr Gwinda Passe to send to [facility]. Notification sent to Dr Lowell Guitar. Orders and instructions routed to [employer] and [employer].

## 2020-07-30 MED ORDER — POLYETHYLENE GLYCOL 3350 100 % ORAL POWDER
17 gram/dose | ORAL | 0 refills | Status: DC
Start: 2020-07-30 — End: 2020-09-17

## 2020-08-04 NOTE — Telephone Encounter (Signed)
RESCHEDULED

## 2020-08-17 ENCOUNTER — Encounter: Primary: Family

## 2020-09-14 ENCOUNTER — Inpatient Hospital Stay: Admit: 2020-09-14 | Payer: MEDICAID | Primary: Family

## 2020-09-14 DIAGNOSIS — Z01812 Encounter for preprocedural laboratory examination: Secondary | ICD-10-CM

## 2020-09-14 LAB — COVID-19 WITH INFLUENZA A/B
Influenza A By PCR: NOT DETECTED
Influenza A by PCR: NOT DETECTED
Influenza B By PCR: NOT DETECTED
Influenza B by PCR: NOT DETECTED
SARS-CoV-2 by PCR: NOT DETECTED
SARS-CoV-2: NOT DETECTED

## 2020-09-14 NOTE — Telephone Encounter (Signed)
UPDATE FAXED TO DR Lowell Guitar

## 2020-09-17 ENCOUNTER — Inpatient Hospital Stay: Payer: MEDICAID

## 2020-09-17 MED ORDER — SODIUM CHLORIDE 0.9 % IV
INTRAVENOUS | Status: DC | PRN
Start: 2020-09-17 — End: 2020-09-17
  Administered 2020-09-17: 16:00:00 via INTRAVENOUS

## 2020-09-17 MED ORDER — PROPOFOL 10 MG/ML IV EMUL
10 mg/mL | INTRAVENOUS | Status: AC
Start: 2020-09-17 — End: ?

## 2020-09-17 MED ORDER — SODIUM CHLORIDE 0.9 % IJ SYRG
Freq: Three times a day (TID) | INTRAMUSCULAR | Status: DC
Start: 2020-09-17 — End: 2020-09-17

## 2020-09-17 MED ORDER — SODIUM CHLORIDE 0.9 % IV
INTRAVENOUS | Status: DC
Start: 2020-09-17 — End: 2020-09-17
  Administered 2020-09-17: 15:00:00 via INTRAVENOUS

## 2020-09-17 MED ORDER — SODIUM CHLORIDE 0.9 % IJ SYRG
INTRAMUSCULAR | Status: DC | PRN
Start: 2020-09-17 — End: 2020-09-17

## 2020-09-17 MED ORDER — PROPOFOL 10 MG/ML IV EMUL
10 mg/mL | INTRAVENOUS | Status: DC | PRN
Start: 2020-09-17 — End: 2020-09-17
  Administered 2020-09-17 (×22): via INTRAVENOUS

## 2020-09-17 MED FILL — SODIUM CHLORIDE 0.9 % IV: INTRAVENOUS | Qty: 1000

## 2020-09-17 MED FILL — PROPOFOL 10 MG/ML IV EMUL: 10 mg/mL | INTRAVENOUS | Qty: 20

## 2020-09-17 MED FILL — NORMAL SALINE FLUSH 0.9 % INJECTION SYRINGE: INTRAMUSCULAR | Qty: 40

## 2020-09-17 NOTE — Anesthesia Post-Procedure Evaluation (Signed)
Procedure(s):  COLONOSCOPY (T I V A).    MAC    Anesthesia Post Evaluation        Patient location during evaluation: bedside  Patient participation: complete - patient participated  Level of consciousness: awake and alert  Pain score: 0  Pain management: adequate  Airway patency: patent  Anesthetic complications: no  Cardiovascular status: acceptable  Respiratory status: acceptable  Hydration status: acceptable  Post anesthesia nausea and vomiting:  none      INITIAL Post-op Vital signs:   Vitals Value Taken Time   BP 154/93 09/17/20 1405   Temp 36.3 ??C (97.3 ??F) 09/17/20 1344   Pulse 85 09/17/20 1405   Resp 20 09/17/20 1405   SpO2 96 % 09/17/20 1405

## 2020-09-17 NOTE — Op Note (Signed)
Colonoscopy Procedure Note      Patient: David Newton. MRN: [id number]  SSN: [id number]    Date of Birth: 1971-01-25  Age: 50 y.o.  Sex: male      Date of Procedure: 09/17/2020  Date/Time:  09/17/2020 1:41 PM       IMPRESSION:     1.  Transverse colon polyp   2.  Ascending colon polyps              3.  Cecal polyp         RECOMMENDATIONS:     1) Check biopsy results.  2) Await pathology report. Call me in 2 weeks if you have not received any information from my office regarding your results.  3) Repeat colonoscopy in 6 months to reevaluate the transverse colon polyp site unless otherwise determined by the pathology report.       INDICATION: Colon screening     PROCEDURE PERFORMED: Colonoscopy with hot snare polypectomy     DESCRIPTION OF PROCEDURE: An informed consent was obtained.  The patient was placed in left lateral position.  Perianal inspection and a digital rectal exam was performed.  Video colonoscope was introduced into the rectum and advanced under direct vision up to the terminal ileum.  With adequate insufflation and maneuvering of the withdrawing scope, the colonic mucosa was visualized carefully.  Retroflexion was performed in the rectum to see the anorectum and also in the ascending colon to look behind the folds.  Vital signs, pulse oximetry, single lead cardiac monitor were monitored throughout the procedure as the sedation was titrated to the desired effect ensuring patient comfort and safety.  The patient tolerated the procedure very well and was transferred to the recovery area. Following is the summary of findings: In the cecum we saw a polyp measuring 0.6 cm removed via hot snare polypectomy.  In the ascending colon we saw multiple polyps with measuring 0.6-0.7 and 0.8 cm above removed via hot snare polypectomy.  In the transverse colon with a large polyp with approximately 3 to 3.5 cm at the head with stalk present.  In a piecemeal pattern we removed that polyp.     ENDOSCOPIST:  Dannette Barbara, MD      ENDOSCOPE: Olympus videocolonoscope     ASSISTANT:Circ-1: Waymon Budge, RN              Scrub Tech-1: [place] Scrape     ANESTHESIA: TIVA      QUALITY OF PREPARATION: Fair      FINDINGS:   1. Transverse colon polyp  2. Ascending colon polyps  3. Cecal polyp    Complications: None     EBL: Minimal    SPECIMENS:   ID Type Source Tests Collected by Time Destination   1 : ascending colon polyps Preservative   Dannette Barbara, MD 09/17/2020 1234 Pathology   2 : transverse colon polyp Preservative   Dannette Barbara, MD 09/17/2020 1308 Pathology             Dannette Barbara, MD  September 17, 2020  1:41 PM

## 2020-09-17 NOTE — H&P (Signed)
History and Physical    David Newton.        02-Oct-1970  [id number]        [id number]     Pre-Procedure Diagnosis:  Screening for colon cancer [Z12.11]    Chief Complaint:  No chief complaint on file.      HPI: 50 year old male with history of hypertension, asthma, schizoaffective disorder, HIV positive, and obstructive sleep apnea who comes in for screening colonoscopy.  Patient has no new complaints today.    Past Medical History:   Diagnosis Date   ??? Asthma 03/03/2020   ??? Essential hypertension 03/03/2020   ??? Hematochezia 03/03/2020   ??? HIV (human immunodeficiency virus infection) (HCC) 02/27/2017   ??? Obesity 03/03/2020   ??? OSA (obstructive sleep apnea) 03/03/2020   ??? Schizoaffective disorder (HCC) 03/03/2020     History reviewed. No pertinent surgical history.  Family History   Problem Relation Age of Onset   ??? OSTEOARTHRITIS Mother    ??? Diabetes Mother    ??? Hypertension Mother    ??? Cancer Father    ??? Hypertension Sister    ??? Lung Disease Brother      Social History     Socioeconomic History   ??? Marital status: SINGLE   Tobacco Use   ??? Smoking status: Never Smoker   ??? Smokeless tobacco: Never Used   Vaping Use   ??? Vaping Use: Never used   Substance and Sexual Activity   ??? Alcohol use: Never   ??? Drug use: Never       Allergies:  No Known Allergies  Medications:   No current facility-administered medications for this encounter.     Vital Signs   Visit Vitals  BP (!) 141/93   Pulse 92   Temp 98.1 ??F (36.7 ??C)   Resp 20   Ht 5\' 4"  (1.626 m)   Wt 133.8 kg (295 lb)   SpO2 96%   BMI 50.64 kg/m??       Review of Systems  Review of systems as noted in HPI.    Physical Exam:  General:  Alert, cooperative, no distress, appears stated age.   Eyes:  No icterus   Neck: Supple, no adenopathy and no JVD.   Lungs:   Clear to auscultation bilaterally.   Heart:  Regular rate and rhythm, S1, S2 normal, no murmur, click, rub or gallop.    Abdomen:   Soft, non-tender. Bowel sounds normal. No masses,  No organomegaly.   Neurologic: Grossly  intact.     Laboratory Data:  No results found for this or any previous visit (from the past 24 hour(s)).        Impression and Plan:  Colon screening  -Colonoscopy       , MD  09/17/2020  10:21 AM

## 2020-09-17 NOTE — Anesthesia Pre-Procedure Evaluation (Signed)
Relevant Problems   RESPIRATORY SYSTEM   (+) Asthma   (+) OSA (obstructive sleep apnea)      NEUROLOGY   (+) Schizoaffective disorder (HCC)      CARDIOVASCULAR   (+) Essential hypertension      ENDOCRINE   (+) Obesity       Anesthetic History   No history of anesthetic complications            Review of Systems / Medical History  Patient summary reviewed, nursing notes reviewed and pertinent labs reviewed    Pulmonary        Sleep apnea    Asthma        Neuro/Psych   Within defined limits           Cardiovascular                       GI/Hepatic/Renal                Endo/Other             Other Findings              Physical Exam    Airway  Mallampati: IV  TM Distance: 4 - 6 cm  Neck ROM: normal range of motion   Mouth opening: Diminished (comment)     Cardiovascular  Regular rate and rhythm,  S1 and S2 normal,  no murmur, click, rub, or gallop             Dental  No notable dental hx       Pulmonary      Decreased breath sounds           Abdominal  GI exam deferred       Other Findings            Anesthetic Plan    ASA: 3  Anesthesia type: MAC    Monitoring Plan: Continuous noninvasive hemodynamic monitoring      Induction: Intravenous  Anesthetic plan and risks discussed with: Patient

## 2020-09-17 NOTE — Progress Notes (Signed)
Dr Gilliam in speaking with patient

## 2020-09-17 NOTE — Progress Notes (Signed)
Tell patient that the polyps removed from his colon were all benign.  He will need a repeat colonoscopy in 6 months to 1 year.  Please give patient a follow-up visit.

## 2020-10-05 NOTE — Telephone Encounter (Signed)
I called and spoke with David Newton, after verified dob, explained the polyps that Dr Gwinda Passe removed were all benign. He would like for the patient to have a 6 month follow up. [name] said ok. He is scheduled for 04-06-2021 at 2pm.

## 2020-10-05 NOTE — Telephone Encounter (Signed)
-----   Message from Dannette Barbara, MD sent at 09/26/2020  4:40 PM EDT -----  Tell patient that the polyps removed from his colon were all benign.  He will need a repeat colonoscopy in 6 months to 1 year.  Please give patient a follow-up visit.

## 2020-11-03 ENCOUNTER — Other Ambulatory Visit (HOSPITAL_COMMUNITY): Payer: Self-pay

## 2020-11-03 MED ORDER — STUDY - REPRIEVE A5332 - PITAVASTATIN 4MG OR PLACEBO TABLET (PI-VAN DAM)
ORAL_TABLET | ORAL | 0 refills | Status: AC
  Filled 2020-11-03: qty 90, 90d supply, fill #0

## 2020-11-04 ENCOUNTER — Other Ambulatory Visit: Payer: Self-pay

## 2020-11-04 ENCOUNTER — Encounter (INDEPENDENT_AMBULATORY_CARE_PROVIDER_SITE_OTHER): Payer: Self-pay | Admitting: *Deleted

## 2020-11-04 VITALS — BP 140/85 | HR 83 | Temp 97.7°F | Wt 295.1 lb

## 2020-11-04 DIAGNOSIS — I872 Venous insufficiency (chronic) (peripheral): Secondary | ICD-10-CM

## 2020-11-04 DIAGNOSIS — Z006 Encounter for examination for normal comparison and control in clinical research program: Secondary | ICD-10-CM

## 2020-11-04 NOTE — Progress Notes (Addendum)
Subjective:    Patient ID: Anthony Rich, male    DOB: 03/01/1971, 50 y.o.   MRN: [id number]  Chief Complaint  Patient presents with  . Research    [phone]  . Edema    Bilateral feet and legs     HPI:  Anthony Rich is a 50 y.o. male morbidly obese with well controlled HIV, adherent to current ART regimen. He presents today  with partner for his A5332 visit and concerns regarding worsening bilateral LEE.  He is currently being treated for HTN with amlodpine, BP 140/85 today in clinic. Partner states he does not elevate legs, follows fast food diet, does not moisturize legs or change socks, and has difficulty climbing stairs,  history of tinea pedis, and has had recent weight gain.  He denies claudication, numbness tingling in feet, wounds on LEE, CP, dizziness, tobacco use history, weakness, rash, HA, fever.    No Known Allergies    Outpatient Medications Prior to Visit  Medication Sig Dispense Refill  . albuterol (VENTOLIN HFA) 108 (90 Base) MCG/ACT inhaler Inhale 2 puffs into the lungs every 6 (six) hours as needed. wheezing 1 each 12  . amLODipine (NORVASC) 5 MG tablet Take 5 mg by mouth daily.    . benztropine (COGENTIN) 1 MG tablet Take 1 mg by mouth 2 (two) times daily.    Marland Kitchen FLUoxetine (PROZAC) 40 MG capsule Take 40 mg by mouth every morning.    . Fluticasone-Salmeterol (ADVAIR DISKUS) 100-50 MCG/DOSE AEPB Inhale 1 puff into the lungs daily. 60 each 3  . GENVOYA 150-150-200-10 MG TABS tablet TAKE 1 TABLET BY MOUTH EVERY DAY WITH BREAKFAST 90 tablet 1  . hydrochlorothiazide (HYDRODIURIL) 25 MG tablet Take 1 tablet (25 mg total) by mouth every morning. 90 tablet 3  . hydrocortisone 2.5 % cream APPLY A THIN LAYER TO THE AFFECTED AREA(S) BY TOPICAL ROUTE 2 TIMES PER DAY FOR TWO WEEKS THEN D/C    . hydrOXYzine (ATARAX/VISTARIL) 10 MG tablet Take 1 tablet (10 mg total) by mouth every 8 (eight) hours as needed for itching. 90 tablet 1  . lithium 600 MG capsule Take 1 capsule  (600 mg total) by mouth 2 (two) times daily with a meal.    . risperiDONE (RISPERDAL) 2 MG tablet Take 1 tablet (2 mg total) by mouth daily.    Sarita Bottom - REPRIEVE [phone] - pitavastatin 4 mg or placebo tablet ([place]) Take 1 tablet (4 mg total) by mouth daily. 90 tablet 11  . Study - REPRIEVE [phone] - pitavastatin 4 mg or placebo tablet ([place]) Take one tablet by mouth once daily.  [id number] F [id number] [facility] A5332 90 tablet 0   Facility-Administered Medications Prior to Visit  Medication Dose Route Frequency Provider Last Rate Last Admin  . cromolyn (OPTICROM) 4 % ophthalmic solution 2 drop  2 drop Both Eyes QID Ginnie Smart, MD         Past Medical History:  Diagnosis Date  . Asthma      No past surgical history on file.     Review of Systems  Constitutional: Positive for unexpected weight change. Negative for activity change, appetite change, chills, diaphoresis, fatigue and fever.  Cardiovascular: Positive for leg swelling. Negative for chest pain and palpitations.  Gastrointestinal: Negative for abdominal pain, constipation, diarrhea and vomiting.  Endocrine: Negative for cold intolerance, heat intolerance, polydipsia, polyphagia and polyuria.  Skin: Negative for color change, pallor, rash and wound.  Neurological: Negative for dizziness, weakness,  light-headedness, numbness and headaches.  Hematological: Negative for adenopathy. Does not bruise/bleed easily.      Objective:    BP 140/85   Pulse 83   Temp 97.7 F (36.5 C) (Oral)   Wt 295 lb 2 oz (133.9 kg)   BMI 49.88 kg/m  Nursing note and vital signs reviewed.  Physical Exam Vitals and nursing note reviewed. Exam conducted with a chaperone present.  Constitutional:      Appearance: Normal appearance. He is obese.  HENT:     Head: Normocephalic and atraumatic.  Eyes:     Pupils: Pupils are equal, round, and reactive to light.  Cardiovascular:     Rate and Rhythm: Normal rate and regular rhythm.      Pulses: Normal pulses.     Heart sounds: Normal heart sounds. No murmur heard. No gallop.   Pulmonary:     Effort: Pulmonary effort is normal.     Breath sounds: Normal breath sounds.  Abdominal:     Palpations: There is no mass.     Tenderness: There is no abdominal tenderness. There is no guarding or rebound.  Musculoskeletal:        General: No swelling.     Right lower leg: Edema present.     Left lower leg: Edema present.     Comments: Bilateral foot, ankle up to knee edema-non pitting in calf with hyperpigmentation (at ankles and feet), rough, dry skin no venous ulcerations present, full and equal sensation and strength bilaterally of LEs  Neurological:     Mental Status: He is alert.      Depression screen [facility] 2/9 03/11/2019 08/20/2017 02/07/2017 03/13/2016 12/01/2013  Decreased Interest 0 0 0 0 0  Down, Depressed, Hopeless 0 0 0 0 0  PHQ - 2 Score 0 0 0 0 0  Altered sleeping - - - - -  Tired, decreased energy - - - - -  Change in appetite - - - - -  Feeling bad or failure about yourself  - - - - -  Trouble concentrating - - - - -  Moving slowly or fidgety/restless - - - - -  Suicidal thoughts - - - - -  PHQ-9 Score - - - - -       Assessment & Plan:   Edema most likely from chronic venous insufficiency-morbidly obese and high sodium diet Advised to keep legs elevated above heart level DASH diet Compression stockings CCB could be source of edema but less likely Monitor weight Encouraged weight loss Change socks regularly Moisturize legs daily Advised to follow up with PCP to discuss treatment options   Patient Active Problem List   Diagnosis Date Noted  . Healthcare maintenance 03/11/2019  . Obesity, Class III, BMI 40-49.9 (morbid obesity) (HCC) 08/20/2017  . Hepatitis B immune 02/07/2017  . Excessive tearing 02/07/2017  . Schizophrenia (HCC) 02/07/2017  . Depression 05/26/2013  . Screening examination for venereal disease 08/08/2012  . Long term (current) use  of bisphosphonates 08/08/2012  . DENTAL CARIES 08/11/2008  . XERODERMA 08/11/2008  . RECTAL BLEEDING 10/28/2007  . Asthma 04/12/2006  . Human immunodeficiency virus (HIV) disease (HCC) 02/20/2003     Problem List Items Addressed This Visit   None   Visit Diagnoses    Chronic venous insufficiency    -  Primary   Examination of participant in clinical trial           I am having Anthony Rich maintain his amLODipine, risperiDONE, lithium,  hydrOXYzine, REPRIEVE A5332 pitavastatin or placebo, benztropine, FLUoxetine, hydrocortisone, hydrochlorothiazide, Genvoya, albuterol, Fluticasone-Salmeterol, and REPRIEVE A5332 pitavastatin or placebo. We will continue to administer cromolyn.   No orders of the defined types were placed in this encounter.    Follow-up: No follow-ups on file.

## 2020-11-04 NOTE — Addendum Note (Signed)
Addended by: Arvilla Meres R on: 11/04/2020 04:23 PM   Modules accepted: Level of Service

## 2020-11-04 NOTE — Research (Signed)
Anthony Rich was here for his month 72 visit for Reprieve, A Randomized Trial to Prevent Vascular Events in HIV (study drug is Pitavastatin 4mg  or placebo). He denies any new problems but his partner did note that the swelling in his feet has gotten much worse.he says he is very adherent with his meds and was given advair for asthma a few months ago. He will be returning in September

## 2020-11-15 ENCOUNTER — Other Ambulatory Visit (HOSPITAL_COMMUNITY): Payer: Self-pay

## 2021-03-11 ENCOUNTER — Encounter (INDEPENDENT_AMBULATORY_CARE_PROVIDER_SITE_OTHER): Payer: Medicaid Other | Admitting: *Deleted

## 2021-03-11 ENCOUNTER — Other Ambulatory Visit (HOSPITAL_COMMUNITY): Payer: Self-pay

## 2021-03-11 ENCOUNTER — Encounter: Payer: Self-pay | Admitting: Infectious Diseases

## 2021-03-11 ENCOUNTER — Other Ambulatory Visit: Payer: Self-pay

## 2021-03-11 ENCOUNTER — Ambulatory Visit (INDEPENDENT_AMBULATORY_CARE_PROVIDER_SITE_OTHER): Payer: Medicaid Other | Admitting: Infectious Diseases

## 2021-03-11 ENCOUNTER — Telehealth: Payer: Self-pay | Admitting: *Deleted

## 2021-03-11 VITALS — BP 148/55 | HR 81 | Temp 98.5°F | Wt 294.0 lb

## 2021-03-11 DIAGNOSIS — F203 Undifferentiated schizophrenia: Secondary | ICD-10-CM

## 2021-03-11 DIAGNOSIS — Z79899 Other long term (current) drug therapy: Secondary | ICD-10-CM

## 2021-03-11 DIAGNOSIS — E66813 Obesity, class 3: Secondary | ICD-10-CM

## 2021-03-11 DIAGNOSIS — Z113 Encounter for screening for infections with a predominantly sexual mode of transmission: Secondary | ICD-10-CM

## 2021-03-11 DIAGNOSIS — B20 Human immunodeficiency virus [HIV] disease: Secondary | ICD-10-CM | POA: Diagnosis not present

## 2021-03-11 DIAGNOSIS — Z006 Encounter for examination for normal comparison and control in clinical research program: Secondary | ICD-10-CM

## 2021-03-11 MED ORDER — GENVOYA 150-150-200-10 MG PO TABS: ORAL_TABLET | ORAL | 3 refills | Status: AC

## 2021-03-11 NOTE — Assessment & Plan Note (Signed)
Encouraged to exercise and watch diet.  Multiple co-morbidities to contribute.

## 2021-03-11 NOTE — Assessment & Plan Note (Signed)
Appears stable on current meds. 

## 2021-03-11 NOTE — Progress Notes (Signed)
   Subjective:    Patient ID: Kaylan Friedmann, male  DOB: [birth year], 50 y.o.        MRN: [id number]   HPI 50 yo M with HIV+, has been followed at [facility], previously on epzicom/sustiva. Changed to genvoya 03-2016.    He had change in insurance and not sure his genvoya is covered.   Had blood work done at PCP ~ 2 weeks ago.   He was involuntarily hospitalized (2018) dx with schizophrenia and schizoaffective bipolar d/o. Is on lithium, feeling well. Had levels checked recently to see if this is related to wt gain.   Denies missed amlodipine.  Has HIV+ partner who is here with him.  HIV 1 RNA Quant  Date Value  06/24/2020 <20 Copies/mL  03/11/2019 <20 NOT DETECTED copies/mL  03/13/2016 6,578 copies/mL (H)   HIV-1 RNA Viral Load (no units)  Date Value  04/11/2018 <40  12/12/2017 <40  08/20/2017 <40   CD4 (no units)  Date Value  02/21/2016 384  09/18/2014 508  07/28/2013 606   CD4 T Cell Abs (/uL)  Date Value  06/24/2020 589  03/11/2019 480  12/18/2016 300 (L)     Health Maintenance  Topic Date Due  . COLONOSCOPY (Pts 45-65yrs Insurance coverage will need to be confirmed)  Never done  . COVID-19 Vaccine (4 - Booster for Pfizer series) 11/08/2020  . INFLUENZA VACCINE  01/31/2021  . TETANUS/TDAP  12/26/2026  . Pneumococcal Vaccine 3-48 Years old (4 - PPSV23 or PCV20) 11/04/2037  . Hepatitis C Screening  Completed  . HIV Screening  Completed  . HPV VACCINES  Aged Out      Review of Systems  Constitutional:  Negative for chills, fever and weight loss.  Respiratory:  Negative for cough and shortness of breath.   Cardiovascular:  Negative for chest pain.  Gastrointestinal:  Negative for constipation and diarrhea.  Genitourinary:  Negative for dysuria.  Neurological:  Negative for headaches.   Please see HPI. All other systems reviewed and negative.     Objective:  Physical Exam Vitals reviewed.  Constitutional:      Appearance: Normal appearance. He is obese.   HENT:     Mouth/Throat:     Mouth: Mucous membranes are moist.     Pharynx: No oropharyngeal exudate.  Eyes:     Extraocular Movements: Extraocular movements intact.     Pupils: Pupils are equal, round, and reactive to light.  Cardiovascular:     Rate and Rhythm: Normal rate and regular rhythm.  Pulmonary:     Effort: Pulmonary effort is normal.     Breath sounds: Normal breath sounds.  Abdominal:     General: Bowel sounds are normal. There is no distension.     Palpations: Abdomen is soft.     Tenderness: There is no abdominal tenderness.  Musculoskeletal:        General: Normal range of motion.     Cervical back: Normal range of motion and neck supple.     Right lower leg: Edema present.     Left lower leg: Edema present.  Neurological:     General: No focal deficit present.     Mental Status: He is alert.  Psychiatric:        Mood and Affect: Mood normal.          Assessment & Plan:

## 2021-03-11 NOTE — Addendum Note (Signed)
Addended by: Juanita Laster on: 03/11/2021 12:44 PM   Modules accepted: Orders

## 2021-03-11 NOTE — Assessment & Plan Note (Addendum)
Doing well Will d/i with pharm about his insurance change Check HIV RNA and CD4 today Meets with [facility] today.  vax are up to date, needs flu and covid for this season when available.  In HIV+ relationship.  Does not meet mpoxx vax criteria.  Check CD4 and HIV RNA today.

## 2021-03-11 NOTE — Research (Signed)
Anthony Rich was here for the Reprieve study and also saw dr. Ninetta Lights today. He denies any new problems and says he is doing a good job taking his research med. He will return in December for the next visit.

## 2021-03-11 NOTE — Telephone Encounter (Signed)
Received signed records request from [facility], but not any direction on how to get them the records. RN called Liliane Shi case manager and asked her to confirm address/fax number. Tammy gave a different address than what is on the letter head, so RN asked her to please fax a request with instructions on how to send back info to [phone]. Records printed and waiting in Triage. Andree Coss, RN

## 2021-03-14 LAB — T-HELPER CELLS (CD4) COUNT (NOT AT ARMC)
Absolute CD4: 606 cells/uL (ref 490–1740)
CD4 T Helper %: 41 % (ref 30–61)
Total lymphocyte count: 1490 cells/uL (ref 850–3900)

## 2021-03-14 LAB — HIV-1 RNA QUANT-NO REFLEX-BLD
HIV 1 RNA Quant: NOT DETECTED Copies/mL
HIV-1 RNA Quant, Log: NOT DETECTED Log cps/mL

## 2021-03-14 LAB — RPR: RPR Ser Ql: NONREACTIVE

## 2021-03-16 NOTE — Telephone Encounter (Signed)
Records request received with fax number: [phone]. Most recent office note and labs faxed to their office.   Mi Balla Loyola Mast, RN

## 2021-04-06 ENCOUNTER — Ambulatory Visit: Payer: MEDICAID | Attending: Gastroenterology | Primary: Internal Medicine

## 2021-04-06 NOTE — Progress Notes (Signed)
 Chief Complaint   Patient presents with    Follow-up    Anal Bleeding     6 month follow up      1. Have you been to the ER, urgent care clinic since your last visit?  Hospitalized since your last visit?No    2. Have you seen or consulted any other health care providers outside of the [facility] since your last visit?  Include any pap smears or colon screening. No  Visit Vitals  BP 136/82 (BP 1 Location: Right arm, BP Patient Position: Sitting, BP Cuff Size: Adult)   Pulse 85   Temp 98.2 F (36.8 C) (Oral)   Resp 18   Ht 5' 4 (1.626 m)   Wt 137.7 kg (303 lb 9.6 oz)   SpO2 93%   BMI 52.11 kg/m

## 2021-04-06 NOTE — Progress Notes (Signed)
This encounter was created in error - please disregard.

## 2021-04-07 ENCOUNTER — Ambulatory Visit: Attending: Gastroenterology | Primary: Internal Medicine

## 2021-04-07 ENCOUNTER — Ambulatory Visit: Admit: 2021-04-07 | Discharge: 2021-04-07 | Payer: MEDICAID | Attending: Gastroenterology | Primary: Internal Medicine

## 2021-04-07 DIAGNOSIS — Z86010 Personal history of colonic polyps: Secondary | ICD-10-CM

## 2021-04-07 MED ORDER — POLYETHYLENE GLYCOL 3350 100 % ORAL POWDER
17 gram/dose | ORAL | 0 refills | Status: AC
Start: 2021-04-07 — End: 2021-05-06

## 2021-04-07 NOTE — Progress Notes (Signed)
Progress Notes  by Elyse Jarvis at 04/07/21 1445                Author: Elyse Jarvis  Service: --  Author Type: Licensed Nurse       Filed: 04/07/21 2316  Encounter Date: 04/07/2021  Status: Signed          Editor: Elyse Jarvis (Licensed Nurse)               Colonoscopy is scheduled for 05-06-2021.   NO PA REQUIRED PER ONLINE [employer] -04-07-2021 AT 5:26 PM

## 2021-04-07 NOTE — Progress Notes (Signed)
Progress Notes by Dannette Barbara, MD at 04/07/21 1445                Author: Dannette Barbara, MD  Service: --  Author Type: Physician       Filed: 04/07/21 2316  Encounter Date: 04/07/2021  Status: Signed          Editor: Dannette Barbara, MD (Physician)               David Alpers. is a 50 y.o. male who presents today for the following:     Chief Complaint       Patient presents with        ?  Follow-up             6 mo follow up              No Known Allergies        Current Outpatient Medications        Medication  Sig         ?  polyethylene glycol (MIRALAX) 17 gram/dose powder  Use as directed  Indications: emptying of the bowel     ?  benztropine (COGENTIN) 1 mg tablet  Take 1 mg by mouth two (2) times a day.     ?  Genvoya tab tablet  TAKE 1 TABLET BY MOUTH EVERY DAY WITH BREAKFAST     ?  FLUoxetine (PROzac) 40 mg capsule  TAKE 1 CAPSULE BY MOUTH AFTER BREAKFAST     ?  lithium carbonate 600 mg capsule  Take 600 mg by mouth two (2) times a day.     ?  risperiDONE (RisperDAL) 2 mg tablet  TAKE 1 TABLET(S) BY ORAL ROUTE TWICE DAILY     ?  albuterol (PROVENTIL HFA, VENTOLIN HFA, PROAIR HFA) 90 mcg/actuation inhaler           ?  neomycin-polymyxin-dexamethasone (MAXITROL) ophthalmic suspension  INSTILL 1 DROP INTO EACH EYE TWICE DAILY FOR 14 DAYS (Patient not taking: No sig reported)          No current facility-administered medications for this visit.             Past Medical History:        Diagnosis  Date         ?  Asthma  03/03/2020     ?  Essential hypertension  03/03/2020     ?  Hematochezia  03/03/2020     ?  HIV (human immunodeficiency virus infection) (HCC)  02/27/2017     ?  Obesity  03/03/2020     ?  OSA (obstructive sleep apnea)  03/03/2020     ?  Schizoaffective disorder (HCC)  03/03/2020         ?  Tubular adenoma of colon  04/07/2021             Past Surgical History:         Procedure  Laterality  Date          ?  COLONOSCOPY  N/A  09/17/2020          COLONOSCOPY (T I V A) performed by  Dannette Barbara, MD at [facility] ENDOSCOPY             Family History         Problem  Relation  Age of Onset          ?  OSTEOARTHRITIS  Mother       ?  Diabetes  Mother       ?  Hypertension  Mother       ?  Cancer  Father       ?  Hypertension  Sister            ?  Lung Disease  Brother               Social History          Socioeconomic History         ?  Marital status:  SINGLE              Spouse name:  Not on file         ?  Number of children:  Not on file     ?  Years of education:  Not on file     ?  Highest education level:  Not on file       Occupational History        ?  Not on file       Tobacco Use         ?  Smoking status:  Never     ?  Smokeless tobacco:  Never       Vaping Use         ?  Vaping Use:  Never used       Substance and Sexual Activity         ?  Alcohol use:  Never     ?  Drug use:  Never     ?  Sexual activity:  Not on file        Other Topics  Concern        ?  Not on file       Social History Narrative        ?  Not on file          Social Determinants of Health          Financial Resource Strain: Not on file     Food Insecurity: Not on file     Transportation Needs: Not on file     Physical Activity: Not on file     Stress: Not on file     Social Connections: Not on file     Intimate Partner Violence: Not on file       Housing Stability: Not on file              HPI   50 year old male with history of hypertension, asthma, schizoaffective disorder, HIV positive, obstructive sleep apnea, and colon polyps who comes in for reevaluation of the colon polyps.  The patient  had a colonoscopy on 09/17/2020 which showed colon polyps in the transverse colon and, ascending colon, and cecum.  The colon screening back as tubulovillous adenoma and a tubular adenoma.  One of the polyps could not be completely removed.  Comes in now  for reevaluation of that lesion..  Patient states he is doing okay.  No problems with his abdomen.  He is eating well has a good appetite.  He states his movements are  regular and formed.  No gross GI bleeding.  He states his weight has been up and down.      Review of Systems    Constitutional: Negative.     HENT: Negative.  Negative for nosebleeds.     Eyes: Negative.     Respiratory: Negative.      Cardiovascular: Negative.  Gastrointestinal: Negative.  Negative for abdominal pain, blood in stool, constipation, diarrhea, heartburn, melena, nausea and vomiting.    Genitourinary: Negative.     Musculoskeletal: Negative.     Skin: Negative.     Neurological: Negative.     Endo/Heme/Allergies: Negative.     Psychiatric/Behavioral:  Positive for depression. The patient  is nervous/anxious.     All other systems reviewed and are negative.         Visit Vitals      BP  (!) 160/80 (BP 1 Location: Right upper arm, BP Patient Position: Sitting, BP Cuff Size: Large adult)     Pulse  87     Temp  98.5 ??F (36.9 ??C) (Temporal)     Resp  16     Ht  5\' 4"  (1.626 m)     Wt  136.9 kg (301 lb 12.8 oz)         SpO2  96%  Comment: room air        BMI  51.80 kg/m??        Physical Exam   Vitals and nursing note reviewed.    Constitutional:        Appearance: Normal appearance. He is obese.    HENT:       Head: Normocephalic and atraumatic.       Nose: Nose normal.       Mouth/Throat:       Mouth: Mucous membranes are moist.       Pharynx: Oropharynx is clear.    Eyes:       General: No scleral icterus.      Extraocular Movements: Extraocular movements intact.       Conjunctiva/sclera: Conjunctivae normal.       Pupils: Pupils are equal, round, and reactive to light.     Cardiovascular:       Rate and Rhythm: Normal rate and regular rhythm.       Heart sounds: Normal heart sounds.    Pulmonary:       Effort: Pulmonary effort is normal.       Breath sounds: Normal breath sounds.     Abdominal:       General: Bowel sounds are normal. There is no distension.       Palpations: Abdomen is soft. There is no mass.       Tenderness: There is no abdominal tenderness. There is no right CVA tenderness, left  CVA tenderness, guarding or rebound.       Hernia: No hernia is present.     Musculoskeletal:          General: Normal range of motion.       Cervical back: Normal range of motion and neck supple.    Skin:      General: Skin is warm and dry.       Coloration: Skin is not jaundiced.    Neurological:       General: No focal deficit present.       Mental Status: He is alert and oriented to person, place, and time.    Psychiatric:          Mood and Affect: Mood normal.          Behavior: Behavior normal.          Thought Content: Thought content normal.          Judgment: Judgment normal.              1. History  of colon polyps   We will reevaluate the patient's colon to see if previous polyp still present although it was previously partially removed.   - COLONOSCOPY,DIAGNOSTIC; Future   - polyethylene glycol (MIRALAX) 17 gram/dose powder; Use as directed  Indications: emptying of the bowel  Dispense: 510 g; Refill: 0      2. HIV infection, unspecified symptom status (HCC)         3. Schizoaffective disorder, unspecified type (HCC)

## 2021-04-07 NOTE — Progress Notes (Signed)
 Progress Notes  by Dasie Gunner at 04/07/21 1445                Author: Dasie Gunner  Service: --  Author Type: Licensed Nurse       Filed: 04/07/21 2316  Encounter Date: 04/07/2021  Status: Signed          Editor: Dasie Gunner (Licensed Nurse)               1. Have you been to the ER, urgent care clinic since your last visit?  Hospitalized since your last visit? no      2. Have you seen or consulted any other health care providers outside of the [facility] since your last visit?  Include any pap smears or colon screening.  No      Chief Complaint       Patient presents with        ?  Follow-up             6 mo follow up        Visit Vitals      BP  (!) 160/80 (BP 1 Location: Right upper arm, BP Patient Position: Sitting, BP Cuff Size: Large adult)     Pulse  87     Temp  98.5 F (36.9 C) (Temporal)     Resp  16     Ht  5' 4 (1.626 m)     Wt  136.9 kg (301 lb 12.8 oz)         SpO2  96%  Comment: room air        BMI  51.80 kg/m

## 2021-05-06 ENCOUNTER — Inpatient Hospital Stay: Payer: MEDICAID

## 2021-05-06 MED ORDER — GLYCOPYRROLATE 0.2 MG/ML IJ SOLN
0.2 mg/mL | INTRAMUSCULAR | Status: AC
Start: 2021-05-06 — End: ?

## 2021-05-06 MED ORDER — PROPOFOL 10 MG/ML IV EMUL
10 mg/mL | INTRAVENOUS | Status: AC
Start: 2021-05-06 — End: ?

## 2021-05-06 MED ORDER — GLYCOPYRROLATE 0.2 MG/ML IJ SOLN
0.2 mg/mL | INTRAMUSCULAR | Status: DC | PRN
Start: 2021-05-06 — End: 2021-05-06
  Administered 2021-05-06: 14:00:00 via INTRAVENOUS

## 2021-05-06 MED ORDER — PROPOFOL 10 MG/ML IV EMUL
10 mg/mL | INTRAVENOUS | Status: DC | PRN
Start: 2021-05-06 — End: 2021-05-06
  Administered 2021-05-06 (×6): via INTRAVENOUS

## 2021-05-06 MED ORDER — LIDOCAINE (PF) 20 MG/ML (2 %) IJ SOLN
20 mg/mL (2 %) | INTRAMUSCULAR | Status: DC | PRN
Start: 2021-05-06 — End: 2021-05-06
  Administered 2021-05-06: 14:00:00 via INTRAVENOUS

## 2021-05-06 MED ORDER — LIDOCAINE (PF) 20 MG/ML (2 %) IJ SOLN
20 mg/mL (2 %) | INTRAMUSCULAR | Status: AC
Start: 2021-05-06 — End: ?

## 2021-05-06 MED ORDER — SODIUM CHLORIDE 0.9 % IV
INTRAVENOUS | Status: DC | PRN
Start: 2021-05-06 — End: 2021-05-06
  Administered 2021-05-06: 14:00:00 via INTRAVENOUS

## 2021-05-06 MED ORDER — SODIUM CHLORIDE 0.9 % IJ SYRG
Freq: Three times a day (TID) | INTRAMUSCULAR | Status: DC
Start: 2021-05-06 — End: 2021-05-06

## 2021-05-06 MED ORDER — SODIUM CHLORIDE 0.9 % IV
INTRAVENOUS | Status: DC
Start: 2021-05-06 — End: 2021-05-06

## 2021-05-06 MED ORDER — SODIUM CHLORIDE 0.9 % IJ SYRG
INTRAMUSCULAR | Status: DC | PRN
Start: 2021-05-06 — End: 2021-05-06

## 2021-05-06 MED FILL — NORMAL SALINE FLUSH 0.9 % INJECTION SYRINGE: INTRAMUSCULAR | Qty: 40

## 2021-05-06 MED FILL — PROPOFOL 10 MG/ML IV EMUL: 10 mg/mL | INTRAVENOUS | Qty: 20

## 2021-05-06 MED FILL — XYLOCAINE-MPF 20 MG/ML (2 %) INJECTION SOLUTION: 20 mg/mL (2 %) | INTRAMUSCULAR | Qty: 5

## 2021-05-06 MED FILL — GLYCOPYRROLATE 0.2 MG/ML IJ SOLN: 0.2 mg/mL | INTRAMUSCULAR | Qty: 1

## 2021-05-06 MED FILL — SODIUM CHLORIDE 0.9 % IV: INTRAVENOUS | Qty: 1000

## 2021-05-06 NOTE — Progress Notes (Signed)
Tell patient that the polyp removed from his colon was benign.  He should have a repeat colonoscopy in 2 years.

## 2021-05-06 NOTE — Anesthesia Post-Procedure Evaluation (Signed)
Procedure(s):  COLONOSCOPY (TIVA).    total IV anesthesia    Anesthesia Post Evaluation      Multimodal analgesia: multimodal analgesia not used between 6 hours prior to anesthesia start to PACU discharge  Patient location during evaluation: PACU  Patient participation: complete - patient participated  Level of consciousness: awake  Pain management: adequate  Anesthetic complications: no  Cardiovascular status: acceptable  Respiratory status: acceptable  Hydration status: acceptable  Post anesthesia nausea and vomiting:  none  Final Post Anesthesia Temperature Assessment:  Normothermia (36.0-37.5 degrees C)      INITIAL Post-op Vital signs:   Vitals Value Taken Time   BP 159/95 05/06/21 1124   Temp 36.5 ??C (97.7 ??F) 05/06/21 1124   Pulse 89 05/06/21 1124   Resp 18 05/06/21 1124   SpO2 99 % 05/06/21 1124

## 2021-05-06 NOTE — Op Note (Signed)
Colonoscopy Procedure Note      Patient: David Newton. MRN: [id number]  SSN: [id number]    Date of Birth: July 12, 1970  Age: 50 y.o.  Sex: male      Date of Procedure: 05/06/2021  Date/Time:  05/06/2021 11:27 AM       IMPRESSION:     1.  Pedunculated sigmoid colon polyps    2.  Transverse colon polyps         RECOMMENDATIONS:     1) Check biopsy results.  2) Await pathology report. Call me in 2 weeks if you have not received any information from my office regarding your results.  3) Repeat colonoscopy in 2 to 3 years or as determined by the pathology report       INDICATION: History of colon polyps    PROCEDURE PERFORMED: Colonoscopy with snare polypectomy  Colonoscopy with cold biopsies     DESCRIPTION OF PROCEDURE: An informed consent was obtained.  The patient was placed in left lateral position.  Perianal inspection and a digital rectal exam was performed.  Video colonoscope was introduced into the rectum and advanced under direct vision up to the terminal ileum.  With adequate insufflation and maneuvering of the withdrawing scope, the colonic mucosa was visualized carefully.  Retroflexion was performed in the rectum to see the anorectum and also in the ascending colon to look behind the folds.  Vital signs, pulse oximetry, single lead cardiac monitor were monitored throughout the procedure as the sedation was titrated to the desired effect ensuring patient comfort and safety.  The patient tolerated the procedure very well and was transferred to the recovery area. Following is the summary of findings:: We saw a large pedunculated polyp in the sigmoid colon which had a head approximately 3 cm stalk that was removed in 2 pieces.  Second polyp in area measured 0.8 cm that was moved via hot snare polypectomy.  1 clip was placed at the stalk of the larger polypectomy site.  3 polyps measuring 0.5 cm each were removed in the transverse colon via cold biopsies.      ENDOSCOPIST: Dannette Barbara,  MD     ENDOSCOPE: Olympus video colonoscope     ASSISTANT:Circ-1: Waymon Budge, RN             Scrub Tech-1: [place] Scrape     ANESTHESIA: TIVA      QUALITY OF PREPARATION:good      FINDINGS:   Pedunculated sigmoid colon polyps  Transverse colon polyps       Complications: None     EBL: Minimal     SPECIMENS:   ID Type Source Tests Collected by Time Destination   1 : sigmoid colon polyps Preservative   Dannette Barbara, MD 05/06/2021 1047 Pathology   2 : transverse colon polyp Preservative   Dannette Barbara, MD 05/06/2021 1049 Pathology             Dannette Barbara, MD  May 06, 2021  11:27 AM

## 2021-05-06 NOTE — Anesthesia Pre-Procedure Evaluation (Signed)
Relevant Problems   RESPIRATORY SYSTEM   (+) Asthma   (+) OSA (obstructive sleep apnea)      NEUROLOGY   (+) Schizoaffective disorder (HCC)      CARDIOVASCULAR   (+) Essential hypertension      ENDOCRINE   (+) Obesity       Anesthetic History   No history of anesthetic complications  Other anesthesia complications          Review of Systems / Medical History  Patient summary reviewed, nursing notes reviewed and pertinent labs reviewed    Pulmonary  Within defined limits      Sleep apnea    Asthma        Neuro/Psych   Within defined limits      Psychiatric history     Cardiovascular  Within defined limits  Hypertension                   GI/Hepatic/Renal  Within defined limits              Endo/Other  Within defined limits      Morbid obesity     Other Findings              Physical Exam    Airway  Mallampati: II  TM Distance: 4 - 6 cm  Neck ROM: normal range of motion        Cardiovascular    Rhythm: regular  Rate: normal         Dental  No notable dental hx       Pulmonary  Breath sounds clear to auscultation               Abdominal  Abdominal exam normal       Other Findings            Anesthetic Plan    ASA: 3  Anesthesia type: total IV anesthesia            Anesthetic plan and risks discussed with: Patient

## 2021-05-06 NOTE — Progress Notes (Signed)
Dr Gilliam in speaking with the patient

## 2021-05-11 NOTE — Telephone Encounter (Signed)
Patient notified polyps benign and repeat colonoscopy in 2 years

## 2021-05-11 NOTE — Telephone Encounter (Signed)
-----   Message from Dannette Barbara, MD sent at 05/11/2021 10:42 AM EST -----  Tell patient that the polyp removed from his colon was benign.  He should have a repeat colonoscopy in 2 years.

## 2021-06-29 ENCOUNTER — Other Ambulatory Visit: Payer: Self-pay

## 2021-06-29 ENCOUNTER — Encounter (INDEPENDENT_AMBULATORY_CARE_PROVIDER_SITE_OTHER): Payer: Medicaid Other | Admitting: *Deleted

## 2021-06-29 DIAGNOSIS — Z006 Encounter for examination for normal comparison and control in clinical research program: Secondary | ICD-10-CM

## 2021-06-29 NOTE — Research (Signed)
Anthony Rich was here for his month 80 visit for [employer]. He denies any new problems. His inhaler has changed from advair to spiriva. He says his adherence is excellent. He will be returning in May.

## 2021-11-03 ENCOUNTER — Encounter: Payer: Medicaid Other | Admitting: *Deleted

## 2021-11-24 ENCOUNTER — Encounter: Payer: Self-pay | Admitting: Infectious Diseases

## 2021-12-08 ENCOUNTER — Ambulatory Visit: Payer: Medicaid Other | Admitting: Infectious Diseases

## 2021-12-09 ENCOUNTER — Ambulatory Visit: Payer: Medicaid Other | Admitting: Infectious Diseases

## 2021-12-31 DEATH — deceased
